# Patient Record
Sex: Female | Born: 1970
Health system: Southern US, Community
[De-identification: ages and names within clinical notes are randomized; demographics above are authoritative.]

## PROBLEM LIST (undated history)

## (undated) DIAGNOSIS — T7840XA Allergy, unspecified, initial encounter: Secondary | ICD-10-CM

## (undated) DIAGNOSIS — K921 Melena: Secondary | ICD-10-CM

## (undated) HISTORY — DX: Allergy, unspecified, initial encounter: T78.40XA

## (undated) HISTORY — DX: Melena: K92.1

## (undated) HISTORY — PX: BREAST BIOPSY: SHX20

---

## 2011-01-05 HISTORY — PX: EYE SURGERY: SHX253

## 2016-08-17 LAB — CBC AND DIFFERENTIAL
HCT: 40 (ref 36–46)
Hemoglobin: 13.8 (ref 12.0–16.0)
Platelets: 238 (ref 150–399)
WBC: 6

## 2016-08-17 LAB — BASIC METABOLIC PANEL
BUN: 15 (ref 4–21)
CREATININE: 0.7 (ref <=1.1)
Glucose: 83
Potassium: 4 (ref 3.4–5.3)
Sodium: 137 (ref 137–147)

## 2016-08-22 LAB — BASIC METABOLIC PANEL
BUN: 15 (ref 4–21)
Creatinine: 0.7 (ref <=1.1)
GLUCOSE: 83
Potassium: 4 (ref 3.4–5.3)
Sodium: 137 (ref 137–147)

## 2016-08-22 LAB — CBC AND DIFFERENTIAL
HCT: 40 (ref 36–46)
Hemoglobin: 13.8 (ref 12.0–16.0)
Platelets: 238 (ref 150–399)
WBC: 6

## 2016-08-22 LAB — TSH: TSH: 2.76 (ref <=5.90)

## 2017-02-13 ENCOUNTER — Encounter: Payer: Self-pay | Admitting: Family Medicine

## 2017-02-13 LAB — HM MAMMOGRAPHY

## 2018-03-25 DIAGNOSIS — Z13220 Encounter for screening for lipoid disorders: Secondary | ICD-10-CM | POA: Diagnosis not present

## 2018-03-25 DIAGNOSIS — Z131 Encounter for screening for diabetes mellitus: Secondary | ICD-10-CM | POA: Diagnosis not present

## 2018-03-25 DIAGNOSIS — Z136 Encounter for screening for cardiovascular disorders: Secondary | ICD-10-CM | POA: Diagnosis not present

## 2018-03-25 DIAGNOSIS — Z0130 Encounter for examination of blood pressure without abnormal findings: Secondary | ICD-10-CM | POA: Diagnosis not present

## 2018-03-25 DIAGNOSIS — Z6829 Body mass index (BMI) 29.0-29.9, adult: Secondary | ICD-10-CM | POA: Diagnosis not present

## 2018-03-25 DIAGNOSIS — Z713 Dietary counseling and surveillance: Secondary | ICD-10-CM | POA: Diagnosis not present

## 2018-09-24 ENCOUNTER — Ambulatory Visit (INDEPENDENT_AMBULATORY_CARE_PROVIDER_SITE_OTHER): Payer: BLUE CROSS/BLUE SHIELD | Admitting: Family Medicine

## 2018-09-24 ENCOUNTER — Encounter: Payer: Self-pay | Admitting: Family Medicine

## 2018-09-24 VITALS — BP 119/79 | HR 78 | Temp 98.5°F | Resp 16 | Ht 63.0 in | Wt 179.2 lb

## 2018-09-24 DIAGNOSIS — Z3009 Encounter for other general counseling and advice on contraception: Secondary | ICD-10-CM

## 2018-09-24 DIAGNOSIS — Z1239 Encounter for other screening for malignant neoplasm of breast: Secondary | ICD-10-CM

## 2018-09-24 DIAGNOSIS — Z3041 Encounter for surveillance of contraceptive pills: Secondary | ICD-10-CM | POA: Insufficient documentation

## 2018-09-24 DIAGNOSIS — Z7689 Persons encountering health services in other specified circumstances: Secondary | ICD-10-CM | POA: Diagnosis not present

## 2018-09-24 DIAGNOSIS — E669 Obesity, unspecified: Secondary | ICD-10-CM | POA: Diagnosis not present

## 2018-09-24 MED ORDER — NORGESTREL-ETHINYL ESTRADIOL 0.3-30 MG-MCG PO TABS: 1.0000 | ORAL_TABLET | Freq: Every day | ORAL | 0 refills | Status: DC

## 2018-09-24 NOTE — Progress Notes (Signed)
Patient ID: Traci Flynn, female  DOB: [birth year], 48 y.o.   MRN: [id number] Patient Care Team    Relationship Specialty Notifications Start End  Ma Hillock, DO PCP - General Family Medicine  09/24/18     Chief Complaint  Patient presents with  . Establish Care     Moved here x1 year ago, Needs pap, mammogram, Prior MD Dr Kathrynn Speed    Subjective:  Traci Flynn is a 48 y.o.  female present for new patient establishment. All past medical history, surgical history, allergies, family history, immunizations, medications and social history were updated in the electronic medical record today. All recent labs, ED visits and hospitalizations within the last year were reviewed. She and her family have moved from [place] 2019.   BCP: G1P1, pt reports she has been on BCP for many years since the birth of her child 2006 (daughter). She does well on medication. Patient's last menstrual period was 09/16/2018 (exact date). Last PAP 2016. Last mam 2018.  She is due for CPE after February. H/O of left breast biopsy/lumpectomy.   Depression screen [facility] 2/9 09/24/2018  Decreased Interest 0  Down, Depressed, Hopeless 0  PHQ - 2 Score 0   GAD 7 : Generalized Anxiety Score 09/24/2018  Nervous, Anxious, on Edge 0  Control/stop worrying 0  Worry too much - different things 0  Trouble relaxing 0  Restless 0  Easily annoyed or irritable 0  Afraid - awful might happen 0  Total GAD 7 Score 0  Anxiety Difficulty Not difficult at all       No flowsheet data found.   There is no immunization history on file for this patient.  No exam data present  Past Medical History:  Diagnosis Date  . Allergies    Seasonal   . Blood in stool    Pt has hemorrhoids   Allergies  Allergen Reactions  . Corn Pollen     Tree pollen and ragweed  . Soybean Extract Allergy Skin Test    Past Surgical History:  Procedure Laterality Date  . BREAST BIOPSY Left    Negative   . EYE SURGERY Left  01/05/2011   Preventative Lattice Surgery    Family History  Problem Relation Age of Onset  . Diabetes Mother   . Hypertension Mother   . Pancreatic cancer Father   . Non-Hodgkin's lymphoma Father   . Alzheimer's disease Maternal Grandmother   . Heart disease Maternal Grandmother   . Bladder Cancer Maternal Grandfather   . Heart disease Paternal Grandmother   . Kidney failure Paternal Grandfather    Social History   Socioeconomic History  . Marital status: Married    Spouse name: Not on file  . Number of children: Not on file  . Years of education: Not on file  . Highest education level: Not on file  Occupational History  . Not on file  Social Needs  . Financial resource strain: Not on file  . Food insecurity:    Worry: Not on file    Inability: Not on file  . Transportation needs:    Medical: Not on file    Non-medical: Not on file  Tobacco Use  . Smoking status: Never Smoker  . Smokeless tobacco: Never Used  Substance and Sexual Activity  . Alcohol use: Never    Frequency: Never  . Drug use: Never  . Sexual activity: Yes    Partners: Male  Lifestyle  . Physical activity:  Days per week: Not on file    Minutes per session: Not on file  . Stress: Not on file  Relationships  . Social connections:    Talks on phone: Not on file    Gets together: Not on file    Attends religious service: Not on file    Active member of club or organization: Not on file    Attends meetings of clubs or organizations: Not on file    Relationship status: Not on file  . Intimate partner violence:    Fear of current or ex partner: Not on file    Emotionally abused: Not on file    Physically abused: Not on file    Forced sexual activity: Not on file  Other Topics Concern  . Not on file  Social History Narrative   Marital status/children/pets: married, 1 child   Education/employment: BS. Homemaker   Safety:      -Wears a bicycle helmet riding a bike: Yes     -smoke alarm in  the home:Yes     - wears seatbelt: Yes     - Feels safe in their relationships: Yes   Allergies as of 09/24/2018      Reactions   Corn Pollen    Tree pollen and ragweed   Soybean Extract Allergy Skin Test       Medication List       Accurate as of September 24, 2018  9:56 AM. Always use your most recent med list.        cetirizine 10 MG tablet Commonly known as:  ZYRTEC Take 10 mg by mouth daily.   multivitamin with minerals Tabs tablet Take 1 tablet by mouth daily.   norgestrel-ethinyl estradiol 0.3-30 MG-MCG tablet Commonly known as:  LOW-OGESTREL Take 1 tablet by mouth daily.       All past medical history, surgical history, allergies, family history, immunizations andmedications were updated in the EMR today and reviewed under the history and medication portions of their EMR.    No results found for this or any previous visit (from the past 2160 hour(s)).  Patient was never admitted.   ROS: 14 pt review of systems performed and negative (unless mentioned in an HPI)  Objective: BP 119/79 (BP Location: Right Arm, Patient Position: Sitting, Cuff Size: Normal)   Pulse 78   Temp 98.5 F (36.9 C) (Oral)   Resp 16   Ht 5\' 3"  (1.6 m)   Wt 179 lb 4 oz (81.3 kg)   LMP 09/16/2018 (Exact Date)   SpO2 100%   BMI 31.75 kg/m  Gen: Afebrile. No acute distress. Nontoxic in appearance, well-developed, well-nourished,  Obese caucasian female.  HENT: AT. [place]. MMM Eyes:Pupils Equal Round Reactive to light, Extraocular movements intact,  Conjunctiva without redness, discharge or icterus. Neck/lymp/endocrine: Supple,no lymphadenopathy, no thyromegaly CV: RRR no murmur, no edema, +2/4 P posterior tibialis pulses.  Chest: CTAB, no wheeze, rhonchi or crackles.  Skin: Warm and well-perfused. Skin intact. Neuro/Msk:  Normal gait. PERLA. EOMi. Alert. Oriented x3.   Psych: Normal affect, dress and demeanor. Normal speech. Normal thought content and  judgment.   Assessment/plan: Traci Flynn is a 48 y.o. female present for New pt establishment Obesity (BMI 30-39.9) Diet and exercise will covered in more detail at upcoming CPE  Birth control counseling - refilled BCP for 3 mos.  - PAP due- she will schedule her CPE within next 3 months- with PAP.   Breast cancer screening - MM 3D SCREEN BREAST BILATERAL;  Future- [facility] pt choice    Return in about 2 months (around 11/23/2018) for CPE.   Note is dictated utilizing voice recognition software. Although note has been proof read prior to signing, occasional typographical errors still can be missed. If any questions arise, please do not hesitate to call for verification.  Electronically signed by: Howard Pouch, DO [place]

## 2018-09-24 NOTE — Patient Instructions (Addendum)
They will call you to schedule your mammogram at [facility] [place].  [address], [address] Schedule physical with PAP within 1-3 months. Come fasting.  Refilled your BCP for 3 mos.   Please help [place] help you:  We are honored you have chosen [facility] for your Primary Care home. Below you will find basic instructions that you may need to access in the future. Please help [place] help you by reading the instructions, which cover many of the frequent questions we experience.   Prescription refills and request:  -In order to allow more efficient response time, please call your pharmacy for all refills. They will forward the request electronically to [place]. This allows for the quickest possible response. Request left on a nurse line can take longer to refill, since these are checked as time allows between office patients and other phone calls.  - refill request can take up to 3-5 working days to complete.  - If request is sent electronically and request is appropiate, it is usually completed in 1-2 business days.  - all patients will need to be seen routinely for all chronic medical conditions requiring prescription medications (see follow-up below). If you are overdue for follow up on your condition, you will be asked to make an appointment and we will call in enough medication to cover you until your appointment (up to 30 days).  - all controlled substances will require a face to face visit to request/refill.  - if you desire your prescriptions to go through a new pharmacy, and have an active script at original pharmacy, you will need to call your pharmacy and have scripts transferred to new pharmacy. This is completed between the pharmacy locations and not by your provider.    Results: If any images or labs were ordered, it can take up to 1 week to get results depending on the test ordered and the lab/facility running and resulting the test. - Normal or stable results, which do  not need further discussion, may be released to your mychart immediately with attached note to you. A call may not be generated for normal results. Please make certain to sign up for mychart. If you have questions on how to activate your mychart you can call the front office.  - If your results need further discussion, our office will attempt to contact you via phone, and if unable to reach you after 2 attempts, we will release your abnormal result to your mychart with instructions.  - All results will be automatically released in mychart after 1 week.  - Your provider will provide you with explanation and instruction on all relevant material in your results. Please keep in mind, results and labs may appear confusing or abnormal to the untrained eye, but it does not mean they are actually abnormal for you personally. If you have any questions about your results that are not covered, or you desire more detailed explanation than what was provided, you should make an appointment with your provider to do so.   Our office handles many outgoing and incoming calls daily. If we have not contacted you within 1 week about your results, please check your mychart to see if there is a message first and if not, then contact our office.  In helping with this matter, you help decrease call volume, and therefore allow [place] to be able to respond to patients needs more efficiently.   Acute office visits (sick visit):  An  acute visit is intended for a new problem and are scheduled in shorter time slots to allow schedule openings for patients with new problems. This is the appropriate visit to discuss a new problem. Problems will not be addressed by phone call or Echart message. Appointment is needed if requesting treatment. In order to provide you with excellent quality medical care with proper time for you to explain your problem, have an exam and receive treatment with instructions, these appointments should be limited to one  new problem per visit. If you experience a new problem, in which you desire to be addressed, please make an acute office visit, we save openings on the schedule to accommodate you. Please do not save your new problem for any other type of visit, let us take care of it properly and quickly for you.   Follow up visits:  Depending on your condition(s) your provider will need to see you routinely in order to provide you with quality care and prescribe medication(s). Most chronic conditions (Example: hypertension, Diabetes, depression/anxiety... etc), require visits a couple times a year. Your provider will instruct you on proper follow up for your personal medical conditions and history. Please make certain to make follow up appointments for your condition as instructed. Failing to do so could result in lapse in your medication treatment/refills. If you request a refill, and are overdue to be seen on a condition, we will always provide you with a 30 day script (once) to allow you time to schedule.    Medicare wellness (well visit): - we have a wonderful Nurse Maudie Mercury), that will meet with you and provide you will yearly medicare wellness visits. These visits should occur yearly (can not be scheduled less than 1 calendar year apart) and cover preventive health, immunizations, advance directives and screenings you are entitled to yearly through your medicare benefits. Do not miss out on your entitled benefits, this is when medicare will pay for these benefits to be ordered for you.  These are strongly encouraged by your provider and is the appropriate type of visit to make certain you are up to date with all preventive health benefits. If you have not had your medicare wellness exam in the last 12 months, please make certain to schedule one by calling the office and schedule your medicare wellness with Maudie Mercury as soon as possible.   Yearly physical (well visit):  - Adults are recommended to be seen yearly for  physicals. Check with your insurance and date of your last physical, most insurances require one calendar year between physicals. Physicals include all preventive health topics, screenings, medical exam and labs that are appropriate for gender/age and history. You may have fasting labs needed at this visit. This is a well visit (not a sick visit), new problems should not be covered during this visit (see acute visit).  - Pediatric patients are seen more frequently when they are younger. Your provider will advise you on well child visit timing that is appropriate for your their age. - This is not a medicare wellness visit. Medicare wellness exams do not have an exam portion to the visit. Some medicare companies allow for a physical, some do not allow a yearly physical. If your medicare allows a yearly physical you can schedule the medicare wellness with our nurse Maudie Mercury and have your physical with your provider after, on the same day. Please check with insurance for your full benefits.   Late Policy/No Shows:  - all new patients should arrive 15-30  minutes earlier than appointment to allow [place] time  to  obtain all personal demographics,  insurance information and for you to complete office paperwork. - All established patients should arrive 10-15 minutes earlier than appointment time to update all information and be checked in .  - In our best efforts to run on time, if you are late for your appointment you will be asked to either reschedule or if able, we will work you back into the schedule. There will be a wait time to work you back in the schedule,  depending on availability.  - If you are unable to make it to your appointment as scheduled, please call 24 hours ahead of time to allow [place] to fill the time slot with someone else who needs to be seen. If you do not cancel your appointment ahead of time, you may be charged a no show fee.

## 2018-10-16 ENCOUNTER — Encounter: Payer: Self-pay | Admitting: Family Medicine

## 2018-10-29 ENCOUNTER — Ambulatory Visit (INDEPENDENT_AMBULATORY_CARE_PROVIDER_SITE_OTHER): Payer: BLUE CROSS/BLUE SHIELD | Admitting: Family Medicine

## 2018-10-29 ENCOUNTER — Encounter: Payer: Self-pay | Admitting: Family Medicine

## 2018-10-29 ENCOUNTER — Other Ambulatory Visit (HOSPITAL_COMMUNITY)
Admission: RE | Admit: 2018-10-29 | Discharge: 2018-10-29 | Disposition: A | Discharge status: Home / Self Care | Payer: BLUE CROSS/BLUE SHIELD | Source: Ambulatory Visit | Attending: Family Medicine | Admitting: Family Medicine

## 2018-10-29 VITALS — BP 129/80 | HR 71 | Temp 98.2°F | Resp 16 | Ht 63.0 in | Wt 177.5 lb

## 2018-10-29 DIAGNOSIS — Z01419 Encounter for gynecological examination (general) (routine) without abnormal findings: Secondary | ICD-10-CM | POA: Insufficient documentation

## 2018-10-29 DIAGNOSIS — Z130 Encounter for screening for diseases of the blood and blood-forming organs and certain disorders involving the immune mechanism: Secondary | ICD-10-CM | POA: Diagnosis not present

## 2018-10-29 DIAGNOSIS — Z3009 Encounter for other general counseling and advice on contraception: Secondary | ICD-10-CM

## 2018-10-29 DIAGNOSIS — Z0000 Encounter for general adult medical examination without abnormal findings: Secondary | ICD-10-CM | POA: Diagnosis not present

## 2018-10-29 DIAGNOSIS — Z79899 Other long term (current) drug therapy: Secondary | ICD-10-CM | POA: Diagnosis not present

## 2018-10-29 DIAGNOSIS — Z131 Encounter for screening for diabetes mellitus: Secondary | ICD-10-CM | POA: Diagnosis not present

## 2018-10-29 DIAGNOSIS — E669 Obesity, unspecified: Secondary | ICD-10-CM | POA: Diagnosis not present

## 2018-10-29 LAB — LIPID PANEL
CHOLESTEROL: 151 mg/dL (ref 0–200)
HDL: 50.8 mg/dL (ref >39.00)
LDL Cholesterol: 86 mg/dL (ref 0–99)
NonHDL: 100.64
Total CHOL/HDL Ratio: 3
Triglycerides: 71 mg/dL (ref 0.0–149.0)
VLDL: 14.2 mg/dL (ref 0.0–40.0)

## 2018-10-29 LAB — COMPREHENSIVE METABOLIC PANEL
ALT: 13 U/L (ref 0–35)
AST: 13 U/L (ref 0–37)
Albumin: 3.9 g/dL (ref 3.5–5.2)
Alkaline Phosphatase: 50 U/L (ref 39–117)
BUN: 12 mg/dL (ref 6–23)
CO2: 28 mEq/L (ref 19–32)
Calcium: 8.7 mg/dL (ref 8.4–10.5)
Chloride: 102 mEq/L (ref 96–112)
Creatinine, Ser: 0.71 mg/dL (ref 0.40–1.20)
GFR: 88.06 mL/min (ref >60.00)
Glucose, Bld: 74 mg/dL (ref 70–99)
Potassium: 4 mEq/L (ref 3.5–5.1)
Sodium: 136 mEq/L (ref 135–145)
Total Bilirubin: 0.5 mg/dL (ref 0.2–1.2)
Total Protein: 7 g/dL (ref 6.0–8.3)

## 2018-10-29 LAB — CBC
HCT: 41.1 % (ref 36.0–46.0)
Hemoglobin: 13.6 g/dL (ref 12.0–15.0)
MCHC: 33.1 g/dL (ref 30.0–36.0)
MCV: 93.2 fl (ref 78.0–100.0)
Platelets: 230 10*3/uL (ref 150.0–400.0)
RBC: 4.41 Mil/uL (ref 3.87–5.11)
RDW: 13.3 % (ref 11.5–15.5)
WBC: 5.7 10*3/uL (ref 4.0–10.5)

## 2018-10-29 LAB — HM PAP SMEAR

## 2018-10-29 LAB — HEMOGLOBIN A1C: HEMOGLOBIN A1C: 5.2 % (ref 4.6–6.5)

## 2018-10-29 MED ORDER — NORGESTREL-ETHINYL ESTRADIOL 0.3-30 MG-MCG PO TABS: 1.0000 | ORAL_TABLET | Freq: Every day | ORAL | 4 refills | Status: DC

## 2018-10-29 NOTE — Patient Instructions (Signed)

## 2018-10-29 NOTE — Progress Notes (Signed)
Patient ID: Traci Flynn, female  DOB: December 05, 1970, 48 y.o.   MRN: [id number] Patient Care Team    Relationship Specialty Notifications Start End  Ma Hillock, DO PCP - General Family Medicine  09/24/18     Chief Complaint  Patient presents with  . Gynecologic Exam    Mammogram scheduled for this Friday, 10/31/2018  . Annual Exam    Fasting. No complaints     Subjective:  Traci Flynn is a 48 y.o.  Female  present for CPE. All past medical history, surgical history, allergies, family history, immunizations, medications and social history were updated in the electronic medical record today. All recent labs, ED visits and hospitalizations within the last year were reviewed.  Well women: G1P1. On BCP. Cycles are unchanged and normal. She denies vaginal discharge, lesions or pain. Married. Monogamous relationship. Prior paps all normal.   Health maintenance:  Colonoscopy: no fhx.  Routine screen at 50 Mammogram: no fhx. completed: ordered last visit. Scheduled this week.. Performs SBE. Cervical cancer screening: last pap: 2016,  Collected today Immunizations: tdap 2019 UTD, Influenza 2019 UTD (encouraged yearly), Infectious disease screening: HIV completed DEXA: routine  Assistive device: none Oxygen TMA:UQJF Patient has a Dental home. Hospitalizations/ED visits: reviewed  Depression screen [facility] 2/9 09/24/2018  Decreased Interest 0  Down, Depressed, Hopeless 0  PHQ - 2 Score 0   GAD 7 : Generalized Anxiety Score 09/24/2018  Nervous, Anxious, on Edge 0  Control/stop worrying 0  Worry too much - different things 0  Trouble relaxing 0  Restless 0  Easily annoyed or irritable 0  Afraid - awful might happen 0  Total GAD 7 Score 0  Anxiety Difficulty Not difficult at all    Immunization History  Administered Date(s) Administered  . Tdap 09/30/2017    Past Medical History:  Diagnosis Date  . Allergies    Seasonal   . Blood in stool    Pt has hemorrhoids    Allergies  Allergen Reactions  . Corn Pollen     Tree pollen and ragweed  . Soybean Extract Allergy Skin Test    Past Surgical History:  Procedure Laterality Date  . BREAST LUMPECTOMY Left    subareolar ductal breast excision - with add'l inferior margin lumpectomy. Fibrocystic change w/ stromal fibrosis and periductal  chronic inflammation. Duct ectasia and focal fibroadenomatoid change. negative for atypical hyperplasia or malignancy.   Marland Kitchen EYE SURGERY Left 01/05/2011   Preventative Lattice Surgery    Family History  Problem Relation Age of Onset  . Diabetes Mother   . Hypertension Mother   . Pancreatic cancer Father   . Non-Hodgkin's lymphoma Father   . Alzheimer's disease Maternal Grandmother   . Heart disease Maternal Grandmother   . Bladder Cancer Maternal Grandfather   . Heart disease Paternal Grandmother   . Kidney failure Paternal Grandfather    Social History   Social History Narrative   Marital status/children/pets: married, 1 child   Education/employment: BS. Homemaker   Safety:      -Wears a bicycle helmet riding a bike: Yes     -smoke alarm in the home:Yes     - wears seatbelt: Yes     - Feels safe in their relationships: Yes    Allergies as of 10/29/2018      Reactions   Corn Pollen    Tree pollen and ragweed   Soybean Extract Allergy Skin Test       Medication List  Accurate as of October 29, 2018  9:49 AM. Always use your most recent med list.        cetirizine 10 MG tablet Commonly known as:  ZYRTEC Take 10 mg by mouth daily.   multivitamin with minerals Tabs tablet Take 1 tablet by mouth daily.   norgestrel-ethinyl estradiol 0.3-30 MG-MCG tablet Commonly known as:  LOW-OGESTREL Take 1 tablet by mouth daily.       All past medical history, surgical history, allergies, family history, immunizations andmedications were updated in the EMR today and reviewed under the history and medication portions of their EMR.     No results found  for this or any previous visit (from the past 2160 hour(s)).  Patient was never admitted.   ROS: 14 pt review of systems performed and negative (unless mentioned in an HPI)  Objective: BP 129/80 (BP Location: Right Arm, Patient Position: Sitting, Cuff Size: Normal)   Pulse 71   Temp 98.2 F (36.8 C) (Oral)   Resp 16   Ht _0  (1.6 m)   Wt 177 lb 8 oz (80.5 kg)   LMP 10/15/2018   SpO2 100%   BMI 31.44 kg/m  Gen: Afebrile. No acute distress. Nontoxic in appearance, well-developed, well-nourished,  Obese, Caucasian female.  HENT: AT. [place]. Bilateral TM visualized and normal in appearance, normal external auditory canal. MMM, no oral lesions, adequate dentition. Bilateral nares within normal limits. Throat without erythema, ulcerations or exudates. no Cough on exam, no hoarseness on exam. Eyes:Pupils Equal Round Reactive to light, Extraocular movements intact,  Conjunctiva without redness, discharge or icterus. Neck/lymp/endocrine: Supple,no lymphadenopathy, no thyromegaly CV: RRR no murmur, no edema, +2/4 P posterior tibialis pulses. no carotid bruits. No JVD. Chest: CTAB, no wheeze, rhonchi or crackles. normal Respiratory effort. good Air movement. Abd: Soft. flat. NTND. BS present. no Masses palpated. No hepatosplenomegaly. No rebound tenderness or guarding. Skin: no rashes, purpura or petechiae. Warm and well-perfused. Skin intact. Neuro/Msk:  Normal gait. PERLA. EOMi. Alert. Oriented x3.  Cranial nerves II through XII intact. Muscle strength 5/5 upper/lower extremity. DTRs equal bilaterally. Psych: Normal affect, dress and demeanor. Normal speech. Normal thought content and judgment. Breasts: breasts appear normal, symmetrical, no tenderness on exam, no suspicious masses, no skin or nipple changes or axillary nodes. GYN:  External genitalia within normal limits, normal hair distribution, no lesions. Urethral meatus normal, no lesions. Vaginal mucosa pink, moist, normal rugae, no  lesions. No cystocele or rectocele. cervix without lesions- friable, no discharge. Bimanual exam revealed normal uterus.  No bladder/suprapubic fullness, masses or tenderness. No cervical motion tenderness. No adnexal fullness. Anus and perineum within normal limits, no lesions.  No exam data present  Assessment/plan: Traci Flynn is a 48 y.o. female present for CPE. Birth control counseling - refills on BCP - followup yearly with CPE Obesity (BMI 30-39.9) - diet and exercise encouraged - Lipid panel Papanicolaou smear, as part of routine gynecological examination - Cytology - PAP( [place]) with HPV cotest Screening for deficiency anemia - CBC Diabetes mellitus screening - HgB A1c Encounter for long-term current use of medication - Comp Met (CMET) Encounter for preventive health examination Patient was encouraged to exercise greater than 150 minutes a week. Patient was encouraged to choose a diet filled with fresh fruits and vegetables, and lean meats. AVS provided to patient today for education/recommendation on gender specific health and safety maintenance. Colonoscopy: no fhx.  Routine screen at 50 Mammogram: no fhx. completed: ordered last visit. Scheduled this week.. Performs SBE.  Cervical cancer screening: last pap: 2016,  Collected today with HPV Immunizations: tdap 2019 UTD, Influenza 2019 UTD (encouraged yearly), Infectious disease screening: HIV completed DEXA: routine   Return in about 1 year (around 10/29/2019) for CPE.  Electronically signed by: Howard Pouch, DO [place]

## 2018-10-30 ENCOUNTER — Encounter: Payer: Self-pay | Admitting: Family Medicine

## 2018-10-30 LAB — CYTOLOGY - PAP
Diagnosis: NEGATIVE
HPV: NOT DETECTED

## 2018-10-31 ENCOUNTER — Encounter (HOSPITAL_BASED_OUTPATIENT_CLINIC_OR_DEPARTMENT_OTHER): Payer: Self-pay

## 2018-10-31 ENCOUNTER — Other Ambulatory Visit (HOSPITAL_BASED_OUTPATIENT_CLINIC_OR_DEPARTMENT_OTHER): Payer: Self-pay | Admitting: Family Medicine

## 2018-10-31 ENCOUNTER — Ambulatory Visit (HOSPITAL_BASED_OUTPATIENT_CLINIC_OR_DEPARTMENT_OTHER)
Admission: RE | Admit: 2018-10-31 | Discharge: 2018-10-31 | Disposition: A | Discharge status: Home / Self Care | Payer: BLUE CROSS/BLUE SHIELD | Source: Ambulatory Visit | Attending: Family Medicine | Admitting: Family Medicine

## 2018-10-31 DIAGNOSIS — Z1231 Encounter for screening mammogram for malignant neoplasm of breast: Secondary | ICD-10-CM | POA: Diagnosis not present

## 2018-11-11 ENCOUNTER — Encounter: Payer: BLUE CROSS/BLUE SHIELD | Admitting: Family Medicine

## 2018-11-25 ENCOUNTER — Encounter: Payer: BLUE CROSS/BLUE SHIELD | Admitting: Family Medicine

## 2019-04-22 DIAGNOSIS — Z13220 Encounter for screening for lipoid disorders: Secondary | ICD-10-CM | POA: Diagnosis not present

## 2019-04-22 DIAGNOSIS — Z131 Encounter for screening for diabetes mellitus: Secondary | ICD-10-CM | POA: Diagnosis not present

## 2019-04-22 DIAGNOSIS — Z136 Encounter for screening for cardiovascular disorders: Secondary | ICD-10-CM | POA: Diagnosis not present

## 2019-04-22 DIAGNOSIS — Z713 Dietary counseling and surveillance: Secondary | ICD-10-CM | POA: Diagnosis not present

## 2019-04-22 DIAGNOSIS — Z6830 Body mass index (BMI) 30.0-30.9, adult: Secondary | ICD-10-CM | POA: Diagnosis not present

## 2019-04-22 DIAGNOSIS — Z0130 Encounter for examination of blood pressure without abnormal findings: Secondary | ICD-10-CM | POA: Diagnosis not present

## 2019-06-17 DIAGNOSIS — Z23 Encounter for immunization: Secondary | ICD-10-CM | POA: Diagnosis not present

## 2019-10-22 ENCOUNTER — Other Ambulatory Visit (HOSPITAL_BASED_OUTPATIENT_CLINIC_OR_DEPARTMENT_OTHER): Payer: Self-pay | Admitting: Family Medicine

## 2019-10-22 DIAGNOSIS — Z1231 Encounter for screening mammogram for malignant neoplasm of breast: Secondary | ICD-10-CM

## 2019-11-04 ENCOUNTER — Ambulatory Visit (HOSPITAL_BASED_OUTPATIENT_CLINIC_OR_DEPARTMENT_OTHER)
Admission: RE | Admit: 2019-11-04 | Discharge: 2019-11-04 | Disposition: A | Discharge status: Home / Self Care | Payer: BC Managed Care – PPO | Source: Ambulatory Visit | Attending: Family Medicine | Admitting: Family Medicine

## 2019-11-04 ENCOUNTER — Other Ambulatory Visit: Payer: Self-pay

## 2019-11-04 DIAGNOSIS — Z1231 Encounter for screening mammogram for malignant neoplasm of breast: Secondary | ICD-10-CM | POA: Insufficient documentation

## 2019-11-20 ENCOUNTER — Ambulatory Visit: Payer: BC Managed Care – PPO | Attending: Internal Medicine

## 2019-11-20 DIAGNOSIS — Z23 Encounter for immunization: Secondary | ICD-10-CM

## 2019-11-20 NOTE — Progress Notes (Signed)
   Covid-19 Vaccination Clinic  Name:  Zinachidi Slusarz    MRN: [id number] DOB: 1970/12/12  11/20/2019  Ms. Rotondo was observed post Covid-19 immunization for 15 minutes without incident. She was provided with Vaccine Information Sheet and instruction to access the V-Safe system.   Ms. Fehler was instructed to call 911 with any severe reactions post vaccine: Marland Kitchen Difficulty breathing  . Swelling of face and throat  . A fast heartbeat  . A bad rash all over body  . Dizziness and weakness   Immunizations Administered    Name Date Dose VIS Date Route   Pfizer COVID-19 Vaccine 11/20/2019 10:32 AM 0.3 mL 08/07/2019 Intramuscular   Manufacturer: [place]   Lot: [id number]   [place]: [id number]

## 2019-12-14 ENCOUNTER — Ambulatory Visit: Payer: BC Managed Care – PPO | Attending: Internal Medicine

## 2019-12-14 DIAGNOSIS — Z23 Encounter for immunization: Secondary | ICD-10-CM

## 2019-12-14 NOTE — Progress Notes (Signed)
   Covid-19 Vaccination Clinic  Name:  Traci Flynn    MRN: [id number] DOB: Oct 21, 1970  12/14/2019  Traci Flynn was observed post Covid-19 immunization for 15 minutes without incident. She was provided with Vaccine Information Sheet and instruction to access the V-Safe system.   Traci Flynn was instructed to call 911 with any severe reactions post vaccine: Marland Kitchen Difficulty breathing  . Swelling of face and throat  . A fast heartbeat  . A bad rash all over body  . Dizziness and weakness   Immunizations Administered    Name Date Dose VIS Date Route   Pfizer COVID-19 Vaccine 12/14/2019  3:14 PM 0.3 mL 10/21/2018 Intramuscular   Manufacturer: [place]   Lot: [id number]   [place]: [id number]

## 2020-01-26 ENCOUNTER — Other Ambulatory Visit: Payer: Self-pay

## 2020-01-26 ENCOUNTER — Encounter: Payer: Self-pay | Admitting: Gastroenterology

## 2020-01-26 ENCOUNTER — Encounter: Payer: Self-pay | Admitting: Family Medicine

## 2020-01-26 ENCOUNTER — Ambulatory Visit (INDEPENDENT_AMBULATORY_CARE_PROVIDER_SITE_OTHER): Payer: BC Managed Care – PPO | Admitting: Family Medicine

## 2020-01-26 VITALS — BP 126/85 | HR 71 | Temp 98.1°F | Resp 18 | Ht 62.5 in | Wt 174.1 lb

## 2020-01-26 DIAGNOSIS — Z0000 Encounter for general adult medical examination without abnormal findings: Secondary | ICD-10-CM

## 2020-01-26 DIAGNOSIS — Z131 Encounter for screening for diabetes mellitus: Secondary | ICD-10-CM | POA: Diagnosis not present

## 2020-01-26 DIAGNOSIS — G8929 Other chronic pain: Secondary | ICD-10-CM | POA: Diagnosis not present

## 2020-01-26 DIAGNOSIS — R002 Palpitations: Secondary | ICD-10-CM | POA: Diagnosis not present

## 2020-01-26 DIAGNOSIS — Z793 Long term (current) use of hormonal contraceptives: Secondary | ICD-10-CM

## 2020-01-26 DIAGNOSIS — Z130 Encounter for screening for diseases of the blood and blood-forming organs and certain disorders involving the immune mechanism: Secondary | ICD-10-CM | POA: Diagnosis not present

## 2020-01-26 DIAGNOSIS — M25562 Pain in left knee: Secondary | ICD-10-CM | POA: Diagnosis not present

## 2020-01-26 DIAGNOSIS — Z1211 Encounter for screening for malignant neoplasm of colon: Secondary | ICD-10-CM

## 2020-01-26 DIAGNOSIS — E669 Obesity, unspecified: Secondary | ICD-10-CM | POA: Diagnosis not present

## 2020-01-26 LAB — COMPREHENSIVE METABOLIC PANEL
ALT: 14 U/L (ref 0–35)
AST: 15 U/L (ref 0–37)
Albumin: 4 g/dL (ref 3.5–5.2)
Alkaline Phosphatase: 45 U/L (ref 39–117)
BUN: 15 mg/dL (ref 6–23)
CO2: 28 mEq/L (ref 19–32)
Calcium: 9 mg/dL (ref 8.4–10.5)
Chloride: 103 mEq/L (ref 96–112)
Creatinine, Ser: 0.7 mg/dL (ref 0.40–1.20)
GFR: 89.05 mL/min (ref >60.00)
Glucose, Bld: 80 mg/dL (ref 70–99)
Potassium: 4.4 mEq/L (ref 3.5–5.1)
Sodium: 136 mEq/L (ref 135–145)
Total Bilirubin: 0.6 mg/dL (ref 0.2–1.2)
Total Protein: 6.9 g/dL (ref 6.0–8.3)

## 2020-01-26 LAB — LIPID PANEL
Cholesterol: 176 mg/dL (ref 0–200)
HDL: 48.9 mg/dL (ref >39.00)
LDL Cholesterol: 113 mg/dL — ABNORMAL HIGH (ref 0–99)
NonHDL: 127.25
Total CHOL/HDL Ratio: 4
Triglycerides: 71 mg/dL (ref 0.0–149.0)
VLDL: 14.2 mg/dL (ref 0.0–40.0)

## 2020-01-26 LAB — CBC
HCT: 41.4 % (ref 36.0–46.0)
Hemoglobin: 13.8 g/dL (ref 12.0–15.0)
MCHC: 33.3 g/dL (ref 30.0–36.0)
MCV: 93.7 fl (ref 78.0–100.0)
Platelets: 239 10*3/uL (ref 150.0–400.0)
RBC: 4.42 Mil/uL (ref 3.87–5.11)
RDW: 12.8 % (ref 11.5–15.5)
WBC: 5.4 10*3/uL (ref 4.0–10.5)

## 2020-01-26 LAB — HEMOGLOBIN A1C: Hgb A1c MFr Bld: 5.3 % (ref 4.6–6.5)

## 2020-01-26 LAB — TSH: TSH: 1.84 u[IU]/mL (ref 0.35–4.50)

## 2020-01-26 MED ORDER — LOW-OGESTREL 0.3-30 MG-MCG PO TABS: 1.0000 | ORAL_TABLET | Freq: Every day | ORAL | 4 refills | Status: DC

## 2020-01-26 NOTE — Patient Instructions (Signed)
Health Maintenance After Age 49 After age 49, you are at a higher risk for certain long-term diseases and infections as well as injuries from falls. Falls are a major cause of broken bones and head injuries in people who are older than age 49. Getting regular preventive care can help to keep you healthy and well. Preventive care includes getting regular testing and making lifestyle changes as recommended by your health care provider. Talk with your health care provider about:  Which screenings and tests you should have. A screening is a test that checks for a disease when you have no symptoms.  A diet and exercise plan that is right for you. What should I know about screenings and tests to prevent falls? Screening and testing are the best ways to find a health problem early. Early diagnosis and treatment give you the best chance of managing medical conditions that are common after age 49. Certain conditions and lifestyle choices may make you more likely to have a fall. Your health care provider may recommend:  Regular vision checks. Poor vision and conditions such as cataracts can make you more likely to have a fall. If you wear glasses, make sure to get your prescription updated if your vision changes.  Medicine review. Work with your health care provider to regularly review all of the medicines you are taking, including over-the-counter medicines. Ask your health care provider about any side effects that may make you more likely to have a fall. Tell your health care provider if any medicines that you take make you feel dizzy or sleepy.  Osteoporosis screening. Osteoporosis is a condition that causes the bones to get weaker. This can make the bones weak and cause them to break more easily.  Blood pressure screening. Blood pressure changes and medicines to control blood pressure can make you feel dizzy.  Strength and balance checks. Your health care provider may recommend certain tests to check your  strength and balance while standing, walking, or changing positions.  Foot health exam. Foot pain and numbness, as well as not wearing proper footwear, can make you more likely to have a fall.  Depression screening. You may be more likely to have a fall if you have a fear of falling, feel emotionally low, or feel unable to do activities that you used to do.  Alcohol use screening. Using too much alcohol can affect your balance and may make you more likely to have a fall. What actions can I take to lower my risk of falls? General instructions  Talk with your health care provider about your risks for falling. Tell your health care provider if: ? You fall. Be sure to tell your health care provider about all falls, even ones that seem minor. ? You feel dizzy, sleepy, or off-balance.  Take over-the-counter and prescription medicines only as told by your health care provider. These include any supplements.  Eat a healthy diet and maintain a healthy weight. A healthy diet includes low-fat dairy products, low-fat (lean) meats, and fiber from whole grains, beans, and lots of fruits and vegetables. Home safety  Remove any tripping hazards, such as rugs, cords, and clutter.  Install safety equipment such as grab bars in bathrooms and safety rails on stairs.  Keep rooms and walkways well-lit. Activity   Follow a regular exercise program to stay fit. This will help you maintain your balance. Ask your health care provider what types of exercise are appropriate for you.  If you need a cane or   walker, use it as recommended by your health care provider.  Wear supportive shoes that have nonskid soles. Lifestyle  Do not drink alcohol if your health care provider tells you not to drink.  If you drink alcohol, limit how much you have: ? 0-1 drink a day for women. ? 0-2 drinks a day for men.  Be aware of how much alcohol is in your drink. In the [place] one drink equals one typical bottle of beer (12  oz), one-half glass of wine (5 oz), or one shot of hard liquor (1 oz).  Do not use any products that contain nicotine or tobacco, such as cigarettes and e-cigarettes. If you need help quitting, ask your health care provider. Summary  Having a healthy lifestyle and getting preventive care can help to protect your health and wellness after age 49.  Screening and testing are the best way to find a health problem early and help you avoid having a fall. Early diagnosis and treatment give you the best chance for managing medical conditions that are more common for people who are older than age 49.  Falls are a major cause of broken bones and head injuries in people who are older than age 49. Take precautions to prevent a fall at home.  Work with your health care provider to learn what changes you can make to improve your health and wellness and to prevent falls. This information is not intended to replace advice given to you by your health care provider. Make sure you discuss any questions you have with your health care provider. Document Revised: 12/04/2018 Document Reviewed: 06/26/2017 Elsevier Patient Education  2020 [employer]vier Inc.  

## 2020-01-26 NOTE — Progress Notes (Signed)
This visit occurred during the SARS-CoV-2 public health emergency.  Safety protocols were in place, including screening questions prior to the visit, additional usage of staff PPE, and extensive cleaning of exam room while observing appropriate contact time as indicated for disinfecting solutions.    Patient ID: Traci Flynn, female  DOB: 06/03/1971, 49 y.o.   MRN: [id number] Patient Care Team    Relationship Specialty Notifications Start End  Ma Hillock, DO PCP - General Family Medicine  09/24/18     Chief Complaint  Patient presents with  . Annual Exam    Fasting, Pap 10/2018, Mammogram 10/2019. Needs refills on meds     Subjective:  Traci Flynn is a 49 y.o.  Female  present for CPE. All past medical history, surgical history, allergies, family history, immunizations, medications and social history were updated in the electronic medical record today. All recent labs, ED visits and hospitalizations within the last year were reviewed.  Patient does complain of infrequent palpitations today.  She reports they are fleeting and only last a few seconds are not associated with any symptoms.  Left knee pain: Patient reports he twisted injury about 1 year ago.  She was not seen at the time of her injury.  She reports she was standing at her kitchen sink and turned to move and twisted her knee.  At that time she did feel a pop and she reports her knee did become immediately swollen.  Her knee will give out on occasions since.  It is painful to walk upstairs.  It occasionally will become swollen and more uncomfortable.  She uses anti-inflammatories when this occurs.  Well women: G1P1. On BCP. Cycles are unchanged and normal. She denies vaginal discharge, lesions or pain. Married. Monogamous relationship. Prior paps all normal.   Health maintenance:  Colonoscopy: no fhx.  Referred to gastroenterology for routine screening. Mammogram: no fhx. completed: 11/04/2019-[facility] Cervical cancer  screening: last pap: 2020,  5 yr f/u Immunizations: tdap 2019 UTD, Influenza  (encouraged yearly),COVID completed Infectious disease screening: HIV completed DEXA: routine  Assistive device: none Oxygen [id number] Patient has a Dental home. Hospitalizations/ED visits: reviewed   Depression screen [facility] 2/9 01/26/2020 09/24/2018  Decreased Interest 0 0  Down, Depressed, Hopeless 0 0  PHQ - 2 Score 0 0   GAD 7 : Generalized Anxiety Score 09/24/2018  Nervous, Anxious, on Edge 0  Control/stop worrying 0  Worry too much - different things 0  Trouble relaxing 0  Restless 0  Easily annoyed or irritable 0  Afraid - awful might happen 0  Total GAD 7 Score 0  Anxiety Difficulty Not difficult at all    Immunization History  Administered Date(s) Administered  . Influenza,inj,Quad PF,6+ Mos 06/17/2019  . Influenza-Unspecified 06/17/2019  . PFIZER SARS-COV-2 Vaccination 11/20/2019, 12/14/2019  . Tdap 09/30/2017    Past Medical History:  Diagnosis Date  . Allergies    Seasonal   . Blood in stool    Pt has hemorrhoids   Allergies  Allergen Reactions  . Corn Pollen     Tree pollen and ragweed  . Hazel Tree Pollen  [Corylus] Itching  . Soybean Extract Allergy Skin Test    Past Surgical History:  Procedure Laterality Date  . BREAST BIOPSY Left   . EYE SURGERY Left 01/05/2011   Preventative Lattice Surgery    Family History  Problem Relation Age of Onset  . Diabetes Mother   . Hypertension Mother   . Pancreatic cancer Father   .  Non-Hodgkin's lymphoma Father   . Alzheimer's disease Maternal Grandmother   . Heart disease Maternal Grandmother   . Bladder Cancer Maternal Grandfather   . Heart disease Paternal Grandmother   . Kidney failure Paternal Grandfather    Social History   Social History Narrative   Marital status/children/pets: married, 1 child   Education/employment: BS. Homemaker   Safety:      -Wears a bicycle helmet riding a bike: Yes     -smoke alarm in the  home:Yes     - wears seatbelt: Yes     - Feels safe in their relationships: Yes    Allergies as of 01/26/2020      Reactions   Corn Pollen    Tree pollen and ragweed   Hazel Tree Pollen  [corylus] Itching   Soybean Extract Allergy Skin Test       Medication List       Accurate as of January 26, 2020 11:59 PM. If you have any questions, ask your nurse or doctor.        cetirizine 10 MG tablet Commonly known as: ZYRTEC Take 10 mg by mouth daily.   Low-Ogestrel 0.3-30 MG-MCG tablet Generic drug: norgestrel-ethinyl estradiol Take 1 tablet by mouth daily.   multivitamin with minerals Tabs tablet Take 1 tablet by mouth daily.       All past medical history, surgical history, allergies, family history, immunizations andmedications were updated in the EMR today and reviewed under the history and medication portions of their EMR.     Recent Results (from the past 2160 hour(s))  CBC     Status: None   Collection Time: 01/26/20 11:01 AM  Result Value Ref Range   WBC 5.4 4.0 - 10.5 K/uL   RBC 4.42 3.87 - 5.11 Mil/uL   Platelets 239.0 150.0 - 400.0 K/uL   Hemoglobin 13.8 12.0 - 15.0 g/dL   HCT 41.4 36.0 - 46.0 %   MCV 93.7 78.0 - 100.0 fl   MCHC 33.3 30.0 - 36.0 g/dL   RDW 12.8 11.5 - 15.5 %  Comprehensive metabolic panel     Status: None   Collection Time: 01/26/20 11:01 AM  Result Value Ref Range   Sodium 136 135 - 145 mEq/L   Potassium 4.4 3.5 - 5.1 mEq/L   Chloride 103 96 - 112 mEq/L   CO2 28 19 - 32 mEq/L   Glucose, Bld 80 70 - 99 mg/dL   BUN 15 6 - 23 mg/dL   Creatinine, Ser 0.70 0.40 - 1.20 mg/dL   Total Bilirubin 0.6 0.2 - 1.2 mg/dL   Alkaline Phosphatase 45 39 - 117 U/L   AST 15 0 - 37 U/L   ALT 14 0 - 35 U/L   Total Protein 6.9 6.0 - 8.3 g/dL   Albumin 4.0 3.5 - 5.2 g/dL   GFR 89.05 >60.00 mL/min   Calcium 9.0 8.4 - 10.5 mg/dL  Hemoglobin A1c     Status: None   Collection Time: 01/26/20 11:01 AM  Result Value Ref Range   Hgb A1c MFr Bld 5.3 4.6 - 6.5 %     Comment: Glycemic Control Guidelines for People with Diabetes:Non Diabetic:  <6%Goal of Therapy: <7%Additional Action Suggested:  >8%   Lipid panel     Status: Abnormal   Collection Time: 01/26/20 11:01 AM  Result Value Ref Range   Cholesterol 176 0 - 200 mg/dL    Comment: ATP III Classification       Desirable:  <  200 mg/dL               Borderline High:  200 - 239 mg/dL          High:  > = 240 mg/dL   Triglycerides 71.0 0.0 - 149.0 mg/dL    Comment: Normal:  <150 mg/dLBorderline High:  150 - 199 mg/dL   HDL 48.90 >39.00 mg/dL   VLDL 14.2 0.0 - 40.0 mg/dL   LDL Cholesterol 113 (H) 0 - 99 mg/dL   Total CHOL/HDL Ratio 4     Comment:                Men          Women1/2 Average Risk     3.4          3.3Average Risk          5.0          4.42X Average Risk          9.6          7.13X Average Risk          15.0          11.0                       NonHDL 127.25     Comment: NOTE:  Non-HDL goal should be 30 mg/dL higher than patient's LDL goal (i.e. LDL goal of < 70 mg/dL, would have non-HDL goal of < 100 mg/dL)  TSH     Status: None   Collection Time: 01/26/20 11:01 AM  Result Value Ref Range   TSH 1.84 0.35 - 4.50 uIU/mL    MM 3D SCREEN BREAST BILATERAL  Result Date: 11/04/2019 CLINICAL DATA:  Screening. EXAM: DIGITAL SCREENING BILATERAL MAMMOGRAM WITH TOMO AND CAD COMPARISON:  Previous exam(s). ACR Breast Density Category b: There are scattered areas of fibroglandular density. FINDINGS: There are no findings suspicious for malignancy. Images were processed with CAD. IMPRESSION: No mammographic evidence of malignancy. A result letter of this screening mammogram will be mailed directly to the patient. RECOMMENDATION: Screening mammogram in one year. (Code:SM-B-01Y) BI-RADS CATEGORY  1: Negative. Electronically Signed   By: Kristopher Oppenheim M.D.   On: 11/04/2019 14:41     ROS: 14 pt review of systems performed and negative (unless mentioned in an HPI)  Objective: BP 126/85 (BP Location: Right  Arm, Patient Position: Sitting, Cuff Size: Normal)   Pulse 71   Temp 98.1 F (36.7 C) (Temporal)   Resp 18   Ht 5' 2.5" (1.588 m)   Wt 174 lb 2 oz (79 kg)   LMP 01/05/2020 (Exact Date)   SpO2 100%   BMI 31.34 kg/m  Gen: Afebrile. No acute distress. Nontoxic in appearance, well-developed, well-nourished, female HENT: AT. [place]. Bilateral TM visualized and normal in appearance, normal external auditory canal. MMM, no oral lesions, adequate dentition. Bilateral nares within normal limits. Throat without erythema, ulcerations or exudates.  No cough on exam, no hoarseness on exam. Eyes:Pupils Equal Round Reactive to light, Extraocular movements intact,  Conjunctiva without redness, discharge or icterus. Neck/lymp/endocrine: Supple, no lymphadenopathy, no thyromegaly CV: RRR no murmur, no edema, +2/4 P posterior tibialis pulses.  Chest: CTAB, no wheeze, rhonchi or crackles.  Normal respiratory effort.  Good air movement. Abd: Soft.  Overweight. NTND. BS present.  No masses palpated. No hepatosplenomegaly. No rebound tenderness or guarding. Skin: No rashes, purpura or petechiae. Warm and well-perfused. Skin intact. Neuro/Msk:  Normal gait. PERLA. EOMi. Alert. Oriented x3.  Cranial nerves II through XII intact. Muscle strength 5/5 upper/lower extremity.  Left knee: No erythema, no soft tissue swelling.  Tender to palpation medial joint line.  Decreased range of motion in flexion with discomfort.  Mild crepitus with extension.  No obvious ligament laxity present.  Neurovascularly intact distally. Psych: Normal affect, dress and demeanor. Normal speech. Normal thought content and judgment.  No exam data present  Assessment/plan: Sundari Payan is a 49 y.o. female present for CPE  Obesity (BMI 30-39.9) Body mass index is 31.34 kg/m. Routine exercise/healthy diet Long term current use of hormonal contraceptive Continue norgestrel-ethinyl estradiol 0.3-30 mg-MCG tablet - Comprehensive metabolic  panel - Lipid panel Diabetes mellitus screening - Hemoglobin A1c Screening for deficiency anemia - CBC Colon cancer screening - Ambulatory referral to Gastroenterology Palpitations Advised to avoid caffeine use.  Hydrate. She was encouraged to follow-up if palpitations worsen, currently only occurring very infrequently and without any associated symptoms. Will rule out thyroid as a potential cause. Patient was educated on concerning associated symptoms and emergent follow-up. - TSH  Chronic pain of left knee Injury approximately 1 year ago.  Possible meniscal versus ligament strain/tear.  Discussed with her options for treatment.  Since it has been over a year I recommended she see orthopedics for further evaluation and treatment plan. In the meantime RICE therapy and NSAIDs during flares. - Ambulatory referral to Orthopedic Surgery  Encounter for preventive health examination Patient was encouraged to exercise greater than 150 minutes a week. Patient was encouraged to choose a diet filled with fresh fruits and vegetables, and lean meats. AVS provided to patient today for education/recommendation on gender specific health and safety maintenance. Colonoscopy: no fhx.  Referred to gastroenterology for routine screening. Mammogram: no fhx. completed: 11/04/2019-[facility] Cervical cancer screening: last pap: 2020,  5 yr f/u Immunizations: tdap 2019 UTD, Influenza  (encouraged yearly),COVID completed Infectious disease screening: HIV completed DEXA: routine  Return in about 1 year (around 01/25/2021) for CPE (30 min).   Orders Placed This Encounter  Procedures  . CBC  . Comprehensive metabolic panel  . Hemoglobin A1c  . Lipid panel  . TSH  . Ambulatory referral to Gastroenterology  . Ambulatory referral to Orthopedic Surgery   Meds ordered this encounter  Medications  . norgestrel-ethinyl estradiol (LOW-OGESTREL) 0.3-30 MG-MCG tablet    Sig: Take 1 tablet by mouth daily.    Dispense:   3 Package    Refill:  4    Referral Orders     Ambulatory referral to Gastroenterology     Ambulatory referral to Orthopedic Surgery   Electronically signed by: Howard Pouch, [place]

## 2020-01-28 ENCOUNTER — Encounter: Payer: Self-pay | Admitting: Family Medicine

## 2020-01-28 DIAGNOSIS — M25562 Pain in left knee: Secondary | ICD-10-CM | POA: Insufficient documentation

## 2020-01-28 DIAGNOSIS — R002 Palpitations: Secondary | ICD-10-CM | POA: Insufficient documentation

## 2020-02-08 ENCOUNTER — Other Ambulatory Visit: Payer: Self-pay

## 2020-02-08 ENCOUNTER — Ambulatory Visit (INDEPENDENT_AMBULATORY_CARE_PROVIDER_SITE_OTHER): Payer: BC Managed Care – PPO | Admitting: Family Medicine

## 2020-02-08 ENCOUNTER — Encounter: Payer: Self-pay | Admitting: Family Medicine

## 2020-02-08 DIAGNOSIS — G8929 Other chronic pain: Secondary | ICD-10-CM | POA: Diagnosis not present

## 2020-02-08 DIAGNOSIS — M25562 Pain in left knee: Secondary | ICD-10-CM

## 2020-02-08 MED ORDER — MELOXICAM 15 MG PO TABS: 7.5000 mg | ORAL_TABLET | Freq: Every day | ORAL | 6 refills | Status: DC | PRN

## 2020-02-08 NOTE — Patient Instructions (Signed)
    Diagnosis:    - Possible lateral meniscus tear. - Early arthritis (chondromalacia patella) behind both kneecaps.   Treatment:  - Aleve 2 pills twice daily for 1-2 weeks and then as-needed (available over the counter).  Or  - Meloxicam for 1-2 weeks and then as needed (I sent in the Rx).   Long-term:  - Glucosamine sulfate 1,000 mg twice daily

## 2020-02-08 NOTE — Progress Notes (Signed)
Office Visit Note   Patient: Katessa Attridge           Date of Birth: 24-May-1971           MRN: [id number] Visit Date: 02/08/2020 Requested by: Ma Hillock, DO [id number] [place],  [place] [address] PCP: Ma Hillock, DO  Subjective: Chief Complaint  Patient presents with  . Left Knee - Pain    Patient comes in today for left knee pain. Pain for 1 year. Thinks she twisted knee. +Popping when bending. Not taking anything for pain.  Tired of knee hurting all the time.      HPI: He is here with left knee pain.  About a year ago she recalls having her left foot planted, she twisted her upper body to the left and felt something pop in the lateral aspect of her knee.  It has bothered her ever since then with certain activities.  No locking, but occasional popping.  Pain is almost always on the anterior lateral aspect.  She has not taken anything for the pain because it is not constant.  No previous problems with her knee.  She has a family history of arthritis in her father who had both knees replaced.               ROS:   All other systems were reviewed and are negative.  Objective: Vital Signs: There were no vitals taken for this visit.  Physical Exam:  General:  Alert and oriented, in no acute distress. Pulm:  Breathing unlabored. Psy:  Normal mood, congruent affect. Skin: No erythema or rash Knees: She has 2-3+ bilateral patellofemoral crepitus.  1+ effusion on the left, none on the right.  Negative patella apprehension test, negative patella compression test.  Lachman's feels solid.  She has lateral joint line tenderness with pain but no palpable click on McMurray's.  No laxity of the collateral ligaments.  Imaging: No results found.  Assessment & Plan: 1.  Chronic left knee pain, suspicious for lateral meniscus tear.  She has underlying bilateral chondromalacia patella. -Discussed options with her, she would like to try either meloxicam or Aleve for the next couple weeks,  followed by glucosamine long-term.  If symptoms persist, then could either inject one time with cortisone, or order x-rays and MRI scan followed by surgical consult if indicated.     Procedures: No procedures performed  No notes on file     PMFS History: Patient Active Problem List   Diagnosis Date Noted  . Chronic pain of left knee 01/28/2020  . Palpitations 01/28/2020  . Papanicolaou smear, as part of routine gynecological examination 10/29/2018  . Birth control counseling 09/24/2018  . Obesity (BMI 30-39.9) 09/24/2018   Past Medical History:  Diagnosis Date  . Allergies    Seasonal   . Blood in stool    Pt has hemorrhoids    Family History  Problem Relation Age of Onset  . Diabetes Mother   . Hypertension Mother   . Pancreatic cancer Father   . Non-Hodgkin's lymphoma Father   . Alzheimer's disease Maternal Grandmother   . Heart disease Maternal Grandmother   . Bladder Cancer Maternal Grandfather   . Heart disease Paternal Grandmother   . Kidney failure Paternal Grandfather     Past Surgical History:  Procedure Laterality Date  . BREAST BIOPSY Left   . EYE SURGERY Left 01/05/2011   Preventative Lattice Surgery    Social History   Occupational History  .  Not on file  Tobacco Use  . Smoking status: Never Smoker  . Smokeless tobacco: Never Used  Vaping Use  . Vaping Use: Never used  Substance and Sexual Activity  . Alcohol use: Never  . Drug use: Never  . Sexual activity: Yes    Partners: Male

## 2020-03-03 ENCOUNTER — Encounter: Payer: Self-pay | Admitting: Family Medicine

## 2020-03-16 DIAGNOSIS — Z131 Encounter for screening for diabetes mellitus: Secondary | ICD-10-CM | POA: Diagnosis not present

## 2020-03-16 DIAGNOSIS — Z0130 Encounter for examination of blood pressure without abnormal findings: Secondary | ICD-10-CM | POA: Diagnosis not present

## 2020-03-16 DIAGNOSIS — Z6830 Body mass index (BMI) 30.0-30.9, adult: Secondary | ICD-10-CM | POA: Diagnosis not present

## 2020-03-16 DIAGNOSIS — Z713 Dietary counseling and surveillance: Secondary | ICD-10-CM | POA: Diagnosis not present

## 2020-03-16 DIAGNOSIS — Z13220 Encounter for screening for lipoid disorders: Secondary | ICD-10-CM | POA: Diagnosis not present

## 2020-03-16 DIAGNOSIS — Z136 Encounter for screening for cardiovascular disorders: Secondary | ICD-10-CM | POA: Diagnosis not present

## 2020-03-16 LAB — BASIC METABOLIC PANEL: Glucose: 81

## 2020-03-16 LAB — LIPID PANEL
Cholesterol: 147 (ref 0–200)
HDL: 50 (ref 35–70)
LDL Cholesterol: 85
Triglycerides: 59 (ref 40–160)

## 2020-03-18 ENCOUNTER — Encounter: Payer: Self-pay | Admitting: Family Medicine

## 2020-03-28 ENCOUNTER — Encounter: Payer: Self-pay | Admitting: Gastroenterology

## 2020-03-28 ENCOUNTER — Ambulatory Visit (AMBULATORY_SURGERY_CENTER): Payer: Self-pay | Admitting: *Deleted

## 2020-03-28 ENCOUNTER — Other Ambulatory Visit: Payer: Self-pay

## 2020-03-28 VITALS — Ht 63.0 in | Wt 176.0 lb

## 2020-03-28 DIAGNOSIS — Z1211 Encounter for screening for malignant neoplasm of colon: Secondary | ICD-10-CM

## 2020-03-28 MED ORDER — SUTAB 1479-225-188 MG PO TABS: 1.0000 | ORAL_TABLET | Freq: Once | ORAL | 0 refills | Status: AC

## 2020-03-28 NOTE — Progress Notes (Signed)
Completed covid vaccine 12-04-19  Pt is aware that care partner will wait in the car during procedure; if they feel like they will be too hot or cold to wait in the car; they may wait in the 4 th floor lobby. Patient is aware to bring only one care partner. We want them to wear a mask (we do not have any that we can provide them), practice social distancing, and we will check their temperatures when they get here.  I did remind the patient that their care partner needs to stay in the parking lot the entire time and have a cell phone available, we will call them when the pt is ready for discharge. Patient will wear mask into building.   No egg allergy  Pt allergic to soybean pollen, but states she can eat soybean containing foods  No home oxygen use   No medications for weight loss taken  emmi information given- via MyChart  Pt denies constipation issues   No trouble with anesthesia, difficulty with moving neck or hx/fam hx of malignant hyperthermia per pt   Sutab code put into RX and paper copy given to pt to show pharmacy

## 2020-04-11 ENCOUNTER — Ambulatory Visit (AMBULATORY_SURGERY_CENTER): Payer: BC Managed Care – PPO | Admitting: Gastroenterology

## 2020-04-11 ENCOUNTER — Other Ambulatory Visit: Payer: Self-pay

## 2020-04-11 ENCOUNTER — Encounter: Payer: Self-pay | Admitting: Gastroenterology

## 2020-04-11 VITALS — BP 123/75 | HR 66 | Temp 98.4°F | Resp 14 | Ht 63.0 in | Wt 176.0 lb

## 2020-04-11 DIAGNOSIS — D125 Benign neoplasm of sigmoid colon: Secondary | ICD-10-CM

## 2020-04-11 DIAGNOSIS — Z1211 Encounter for screening for malignant neoplasm of colon: Secondary | ICD-10-CM | POA: Diagnosis not present

## 2020-04-11 MED ORDER — SODIUM CHLORIDE 0.9 % IV SOLN: 500.0000 mL | Freq: Once | INTRAVENOUS | Status: DC

## 2020-04-11 NOTE — Op Note (Signed)
[facility] [place] Patient Name: Traci Flynn Procedure Date: 04/11/2020 8:52 AM MRN: [id number] Endoscopist: Mauri Pole , MD Age: 49 Referring MD:  Date of Birth: 1970-11-09 Gender: Female Account #: [id number] Procedure:                Colonoscopy Indications:              Screening for colorectal malignant neoplasm Medicines:                Monitored Anesthesia Care Procedure:                Pre-Anesthesia Assessment:                           - Prior to the procedure, a History and Physical                            was performed, and patient medications and                            allergies were reviewed. The patient's tolerance of                            previous anesthesia was also reviewed. The risks                            and benefits of the procedure and the sedation                            options and risks were discussed with the patient.                            All questions were answered, and informed consent                            was obtained. Prior Anticoagulants: The patient has                            taken no previous anticoagulant or antiplatelet                            agents. ASA Grade Assessment: II - A patient with                            mild systemic disease. After reviewing the risks                            and benefits, the patient was deemed in                            satisfactory condition to undergo the procedure.                           After obtaining informed consent, the colonoscope  was passed under direct vision. Throughout the                            procedure, the patient's blood pressure, pulse, and                            oxygen saturations were monitored continuously. The                            Colonoscope was introduced through the anus and                            advanced to the the cecum, identified by                            appendiceal orifice and  ileocecal valve. The                            colonoscopy was performed without difficulty. The                            patient tolerated the procedure well. The quality                            of the bowel preparation was excellent. The                            ileocecal valve, appendiceal orifice, and rectum                            were photographed. Scope In: 8:57:50 AM Scope Out: 9:20:10 AM Scope Withdrawal Time: 0 hours 10 minutes 19 seconds  Total Procedure Duration: 0 hours 22 minutes 20 seconds  Findings:                 The perianal and digital rectal examinations were                            normal.                           A 5 mm polyp was found in the sigmoid colon. The                            polyp was sessile. The polyp was removed with a                            cold snare. Resection and retrieval were complete.                           Multiple small and large-mouthed diverticula were                            found in the sigmoid colon, descending colon,  transverse colon, ascending colon and cecum. There                            was evidence of an impacted diverticulum.                           Non-bleeding internal hemorrhoids were found during                            retroflexion. The hemorrhoids were medium-sized.                           The exam was otherwise without abnormality. Complications:            No immediate complications. Estimated Blood Loss:     Estimated blood loss was minimal. Impression:               - One 5 mm polyp in the sigmoid colon, removed with                            a cold snare. Resected and retrieved.                           - Moderate diverticulosis in the sigmoid colon, in                            the descending colon, in the transverse colon, in                            the ascending colon and in the cecum. There was                            evidence of an impacted  diverticulum.                           - Non-bleeding internal hemorrhoids.                           - The examination was otherwise normal. Recommendation:           - Patient has a contact number available for                            emergencies. The signs and symptoms of potential                            delayed complications were discussed with the                            patient. Return to normal activities tomorrow.                            Written discharge instructions were provided to the  patient.                           - Resume previous diet.                           - Continue present medications.                           - Await pathology results.                           - Repeat colonoscopy in 5-10 years for surveillance                            based on pathology results. Mauri Pole, MD 04/11/2020 9:28:27 AM This report has been signed electronically.

## 2020-04-11 NOTE — Progress Notes (Signed)
Called to room to assist during endoscopic procedure.  Patient ID and intended procedure confirmed with present staff. Received instructions for my participation in the procedure from the performing physician.  

## 2020-04-11 NOTE — Progress Notes (Signed)
Report to PACU, RN, vss, BBS= Clear.  

## 2020-04-11 NOTE — Progress Notes (Signed)
VS-CW  Pt's states no medical or surgical changes since previsit or office visit.  

## 2020-04-11 NOTE — Patient Instructions (Signed)
HANDOUTS PROVIDED ON: POLYPS, DIVERTICULOSIS, & HEMORRHOIDS  The polyp removed today have been sent for pathology.  The results can take 1-3 weeks to receive.  When your next colonoscopy should occur will be based on the pathology results.    You may resume your previous diet and medication schedule.  Thank you for allowing us to care for you today!!!   YOU HAD AN ENDOSCOPIC PROCEDURE TODAY AT THE [facility]:   Refer to the procedure report that was given to you for any specific questions about what was found during the examination.  If the procedure report does not answer your questions, please call your gastroenterologist to clarify.  If you requested that your care partner not be given the details of your procedure findings, then the procedure report has been included in a sealed envelope for you to review at your convenience later.  YOU SHOULD EXPECT: Some feelings of bloating in the abdomen. Passage of more gas than usual.  Walking can help get rid of the air that was put into your GI tract during the procedure and reduce the bloating. If you had a lower endoscopy (such as a colonoscopy or flexible sigmoidoscopy) you may notice spotting of blood in your stool or on the toilet paper. If you underwent a bowel prep for your procedure, you may not have a normal bowel movement for a few days.  Please Note:  You might notice some irritation and congestion in your nose or some drainage.  This is from the oxygen used during your procedure.  There is no need for concern and it should clear up in a day or so.  SYMPTOMS TO REPORT IMMEDIATELY:   Following lower endoscopy (colonoscopy or flexible sigmoidoscopy):  Excessive amounts of blood in the stool  Significant tenderness or worsening of abdominal pains  Swelling of the abdomen that is new, acute  Fever of 100F or higher  For urgent or emergent issues, a gastroenterologist can be reached at any hour by calling [phone]. Do  not use MyChart messaging for urgent concerns.    DIET:  We do recommend a small meal at first, but then you may proceed to your regular diet.  Drink plenty of fluids but you should avoid alcoholic beverages for 24 hours.  ACTIVITY:  You should plan to take it easy for the rest of today and you should NOT DRIVE or use heavy machinery until tomorrow (because of the sedation medicines used during the test).    FOLLOW UP: Our staff will call the number listed on your records 48-72 hours following your procedure to check on you and address any questions or concerns that you may have regarding the information given to you following your procedure. If we do not reach you, we will leave a message.  We will attempt to reach you two times.  During this call, we will ask if you have developed any symptoms of COVID 19. If you develop any symptoms (ie: fever, flu-like symptoms, shortness of breath, cough etc.) before then, please call [phone].  If you test positive for Covid 19 in the 2 weeks post procedure, please call and report this information to us.    If any biopsies were taken you will be contacted by phone or by letter within the next 1-3 weeks.  Please call us at [phone] if you have not heard about the biopsies in 3 weeks.    SIGNATURES/CONFIDENTIALITY: You and/or your care partner have signed paperwork which will   be entered into your electronic medical record.  These signatures attest to the fact that that the information above on your After Visit Summary has been reviewed and is understood.  Full responsibility of the confidentiality of this discharge information lies with you and/or your care-partner. 

## 2020-04-13 ENCOUNTER — Telehealth: Payer: Self-pay | Admitting: *Deleted

## 2020-04-13 NOTE — Telephone Encounter (Signed)
  Follow up Call-  Call back number 04/11/2020  Post procedure Call Back phone  # [phone]  Permission to leave phone message Yes  Some recent data might be hidden     Patient questions:  Do you have a fever, pain , or abdominal swelling? No. Pain Score  0 *  Have you tolerated food without any problems? Yes.    Have you been able to return to your normal activities? Yes.    Do you have any questions about your discharge instructions: Diet   No. Medications  No. Follow up visit  No.  Do you have questions or concerns about your Care? No.  Actions: * If pain score is 4 or above: 1. No action needed, pain <4.Have you developed a fever since your procedure? no  2.   Have you had an respiratory symptoms (SOB or cough) since your procedure? no  3.   Have you tested positive for COVID 19 since your procedure no  4.   Have you had any family members/close contacts diagnosed with the COVID 19 since your procedure?  no   If yes to any of these questions please route to Joylene John, RN and Joella Prince, RN

## 2020-04-19 ENCOUNTER — Encounter: Payer: Self-pay | Admitting: Gastroenterology

## 2020-12-16 ENCOUNTER — Other Ambulatory Visit (HOSPITAL_BASED_OUTPATIENT_CLINIC_OR_DEPARTMENT_OTHER): Payer: Self-pay | Admitting: Family Medicine

## 2020-12-16 DIAGNOSIS — Z1231 Encounter for screening mammogram for malignant neoplasm of breast: Secondary | ICD-10-CM

## 2020-12-19 ENCOUNTER — Ambulatory Visit (HOSPITAL_BASED_OUTPATIENT_CLINIC_OR_DEPARTMENT_OTHER)
Admission: RE | Admit: 2020-12-19 | Discharge: 2020-12-19 | Disposition: A | Discharge status: Home / Self Care | Payer: BC Managed Care – PPO | Source: Ambulatory Visit | Attending: Family Medicine | Admitting: Family Medicine

## 2020-12-19 ENCOUNTER — Other Ambulatory Visit: Payer: Self-pay

## 2020-12-19 DIAGNOSIS — Z1231 Encounter for screening mammogram for malignant neoplasm of breast: Secondary | ICD-10-CM | POA: Insufficient documentation

## 2021-03-10 DIAGNOSIS — Z6831 Body mass index (BMI) 31.0-31.9, adult: Secondary | ICD-10-CM | POA: Diagnosis not present

## 2021-03-10 DIAGNOSIS — Z713 Dietary counseling and surveillance: Secondary | ICD-10-CM | POA: Diagnosis not present

## 2021-03-10 DIAGNOSIS — Z13220 Encounter for screening for lipoid disorders: Secondary | ICD-10-CM | POA: Diagnosis not present

## 2021-03-10 DIAGNOSIS — Z131 Encounter for screening for diabetes mellitus: Secondary | ICD-10-CM | POA: Diagnosis not present

## 2021-03-10 DIAGNOSIS — Z136 Encounter for screening for cardiovascular disorders: Secondary | ICD-10-CM | POA: Diagnosis not present

## 2021-03-17 ENCOUNTER — Other Ambulatory Visit (INDEPENDENT_AMBULATORY_CARE_PROVIDER_SITE_OTHER): Payer: BC Managed Care – PPO

## 2021-03-17 ENCOUNTER — Encounter: Payer: Self-pay | Admitting: Family Medicine

## 2021-03-17 ENCOUNTER — Ambulatory Visit (INDEPENDENT_AMBULATORY_CARE_PROVIDER_SITE_OTHER): Payer: BC Managed Care – PPO | Admitting: Family Medicine

## 2021-03-17 ENCOUNTER — Other Ambulatory Visit: Payer: Self-pay

## 2021-03-17 VITALS — BP 129/80 | HR 72 | Temp 98.4°F | Ht 62.75 in | Wt 180.0 lb

## 2021-03-17 DIAGNOSIS — Z1159 Encounter for screening for other viral diseases: Secondary | ICD-10-CM

## 2021-03-17 DIAGNOSIS — E669 Obesity, unspecified: Secondary | ICD-10-CM

## 2021-03-17 DIAGNOSIS — Z0000 Encounter for general adult medical examination without abnormal findings: Secondary | ICD-10-CM

## 2021-03-17 DIAGNOSIS — Z131 Encounter for screening for diabetes mellitus: Secondary | ICD-10-CM

## 2021-03-17 DIAGNOSIS — Z793 Long term (current) use of hormonal contraceptives: Secondary | ICD-10-CM | POA: Diagnosis not present

## 2021-03-17 DIAGNOSIS — Z1231 Encounter for screening mammogram for malignant neoplasm of breast: Secondary | ICD-10-CM

## 2021-03-17 DIAGNOSIS — Z3041 Encounter for surveillance of contraceptive pills: Secondary | ICD-10-CM

## 2021-03-17 MED ORDER — LOW-OGESTREL 0.3-30 MG-MCG PO TABS: 1.0000 | ORAL_TABLET | Freq: Every day | ORAL | 3 refills | Status: DC

## 2021-03-17 NOTE — Patient Instructions (Addendum)
Traci Flynn for Labs today  [address] [place], [place], [place] [address] Health Maintenance, Female Adopting a healthy lifestyle and getting preventive care are important in promoting health and wellness. Ask your health care provider about: The right schedule for you to have regular tests and exams. Things you can do on your own to prevent diseases and keep yourself healthy. What should I know about diet, weight, and exercise? Eat a healthy diet  Eat a diet that includes plenty of vegetables, fruits, low-fat dairy products, and lean protein. Do not eat a lot of foods that are high in solid fats, added sugars, or sodium.  Maintain a healthy weight Body mass index (BMI) is used to identify weight problems. It estimates body fat based on height and weight. Your health care provider can help determineyour BMI and help you achieve or maintain a healthy weight. Get regular exercise Get regular exercise. This is one of the most important things you can do for your health. Most adults should: Exercise for at least 150 minutes each week. The exercise should increase your heart rate and make you sweat (moderate-intensity exercise). Do strengthening exercises at least twice a week. This is in addition to the moderate-intensity exercise. Spend less time sitting. Even light physical activity can be beneficial. Watch cholesterol and blood lipids Have your blood tested for lipids and cholesterol at 50 years of age, then havethis test every 5 years. Have your cholesterol levels checked more often if: Your lipid or cholesterol levels are high. You are older than 50 years of age. You are at high risk for heart disease. What should I know about cancer screening? Depending on your health history and family history, you may need to have cancer screening at various ages. This may include screening for: Breast cancer. Cervical cancer. Colorectal cancer. Skin cancer. Lung cancer. What should I know  about heart disease, diabetes, and high blood pressure? Blood pressure and heart disease High blood pressure causes heart disease and increases the risk of stroke. This is more likely to develop in people who have high blood pressure readings, are of African descent, or are overweight. Have your blood pressure checked: Every 3-5 years if you are 37-45 years of age. Every year if you are 41 years old or older. Diabetes Have regular diabetes screenings. This checks your fasting blood sugar level. Have the screening done: Once every three years after age 21 if you are at a normal weight and have a low risk for diabetes. More often and at a younger age if you are overweight or have a high risk for diabetes. What should I know about preventing infection? Hepatitis B If you have a higher risk for hepatitis B, you should be screened for this virus. Talk with your health care provider to find out if you are at risk forhepatitis B infection. Hepatitis C Testing is recommended for: Everyone born from [birth year] through 1965. Anyone with known risk factors for hepatitis C. Sexually transmitted infections (STIs) Get screened for STIs, including gonorrhea and chlamydia, if: You are sexually active and are younger than 50 years of age. You are older than 50 years of age and your health care provider tells you that you are at risk for this type of infection. Your sexual activity has changed since you were last screened, and you are at increased risk for chlamydia or gonorrhea. Ask your health care provider if you are at risk. Ask your health care provider about whether you are at high  risk for HIV. Your health care provider may recommend a prescription medicine to help prevent HIV infection. If you choose to take medicine to prevent HIV, you should first get tested for HIV. You should then be tested every 3 months for as long as you are taking the medicine. Pregnancy If you are about to stop having your period  (premenopausal) and you may become pregnant, seek counseling before you get pregnant. Take 400 to 800 micrograms (mcg) of folic acid every day if you become pregnant. Ask for birth control (contraception) if you want to prevent pregnancy. Osteoporosis and menopause Osteoporosis is a disease in which the bones lose minerals and strength with aging. This can result in bone fractures. If you are 33 years old or older, or if you are at risk for osteoporosis and fractures, ask your health care provider if you should: Be screened for bone loss. Take a calcium or vitamin D supplement to lower your risk of fractures. Be given hormone replacement therapy (HRT) to treat symptoms of menopause. Follow these instructions at home: Lifestyle Do not use any products that contain nicotine or tobacco, such as cigarettes, e-cigarettes, and chewing tobacco. If you need help quitting, ask your health care provider. Do not use street drugs. Do not share needles. Ask your health care provider for help if you need support or information about quitting drugs. Alcohol use Do not drink alcohol if: Your health care provider tells you not to drink. You are pregnant, may be pregnant, or are planning to become pregnant. If you drink alcohol: Limit how much you use to 0-1 drink a day. Limit intake if you are breastfeeding. Be aware of how much alcohol is in your drink. In the [place] one drink equals one 12 oz bottle of beer (355 mL), one 5 oz glass of wine (148 mL), or one 1 oz glass of hard liquor (44 mL). General instructions Schedule regular health, dental, and eye exams. Stay current with your vaccines. Tell your health care provider if: You often feel depressed. You have ever been abused or do not feel safe at home. Summary Adopting a healthy lifestyle and getting preventive care are important in promoting health and wellness. Follow your health care provider's instructions about healthy diet, exercising, and  getting tested or screened for diseases. Follow your health care provider's instructions on monitoring your cholesterol and blood pressure. This information is not intended to replace advice given to you by your health care provider. Make sure you discuss any questions you have with your healthcare provider. Document Revised: 08/06/2018 Document Reviewed: 08/06/2018 Elsevier Patient Education  2022 [employer].

## 2021-03-17 NOTE — Progress Notes (Signed)
This visit occurred during the SARS-CoV-2 public health emergency.  Safety protocols were in place, including screening questions prior to the visit, additional usage of staff PPE, and extensive cleaning of exam room while observing appropriate contact time as indicated for disinfecting solutions.    Patient ID: Traci Flynn, female  DOB: 10-17-1970, 50 y.o.   MRN: [id number] Patient Care Team    Relationship Specialty Notifications Start End  Ma Hillock, DO PCP - General Family Medicine  09/24/18     Chief Complaint  Patient presents with   Annual Exam    Pt is fasting;     Subjective: Traci Flynn is a 50 y.o.  Female  present for CPE. All past medical history, surgical history, allergies, family history, immunizations, medications and social history were updated in the electronic medical record today. All recent labs, ED visits and hospitalizations within the last year were reviewed.  Health maintenance:  Colonoscopy: no fhx.  04/11/2020- Dr. Silverio Decamp- 7 yr follow- polyps Mammogram: no fhx. completed: 11/2020-[facility]> order placed for next yr.  Cervical cancer screening: last pap: 2020,  5 yr f/u Immunizations: tdap 2019 UTD, Influenza  (encouraged yearly),COVID completed. Shingrix ok by nurse visit after 05/14/2019 if desired.  Infectious disease screening: HIV completed- hep C collected today. DEXA: routine  Assistive device: none Oxygen [id number] Patient has a Dental home. Hospitalizations/ED visits: reviewed     Depression screen [facility] 2/9 03/17/2021 01/26/2020 09/24/2018  Decreased Interest 0 0 0  Down, Depressed, Hopeless 0 0 0  PHQ - 2 Score 0 0 0   GAD 7 : Generalized Anxiety Score 09/24/2018  Nervous, Anxious, on Edge 0  Control/stop worrying 0  Worry too much - different things 0  Trouble relaxing 0  Restless 0  Easily annoyed or irritable 0  Afraid - awful might happen 0  Total GAD 7 Score 0  Anxiety Difficulty Not difficult at all    Immunization History   Administered Date(s) Administered   Influenza,inj,Quad PF,6+ Mos 06/17/2019   Influenza-Unspecified 06/17/2019   PFIZER(Purple Top)SARS-COV-2 Vaccination 11/20/2019, 12/14/2019, 07/27/2020   Tdap 09/30/2017   Past Medical History:  Diagnosis Date   Allergies    Seasonal    Allergy    Blood in stool    Pt has hemorrhoids   Allergies  Allergen Reactions   Corn Pollen     Tree pollen and ragweed   Hazel Tree Pollen  [Corylus] Itching   Soybean Extract Allergy Skin Test     Soybean pollen only- able to eat soybean containing food   Past Surgical History:  Procedure Laterality Date   BREAST BIOPSY Left    nipple   EYE SURGERY Left 01/05/2011   Preventative Lattice Surgery    Family History  Problem Relation Age of Onset   Diabetes Mother    Hypertension Mother    Pancreatic cancer Father    Non-Hodgkin's lymphoma Father    Alzheimer's disease Maternal Grandmother    Heart disease Maternal Grandmother    Bladder Cancer Maternal Grandfather    Heart disease Paternal Grandmother    Kidney failure Paternal Grandfather    Colon cancer Neg Hx    Esophageal cancer Neg Hx    Stomach cancer Neg Hx    Rectal cancer Neg Hx    Social History   Social History Narrative   Marital status/children/pets: married, 1 child   Education/employment: BS. Industrial/product designer:      -Wears a bicycle helmet riding a bike: Yes     -  smoke alarm in the home:Yes     - wears seatbelt: Yes     - Feels safe in their relationships: Yes    Allergies as of 03/17/2021       Reactions   Corn Pollen    Tree pollen and ragweed   Hazel Tree Pollen  [corylus] Itching   Soybean Extract Allergy Skin Test    Soybean pollen only- able to eat soybean containing food        Medication List        Accurate as of March 17, 2021 10:24 AM. If you have any questions, ask your nurse or doctor.          STOP taking these medications    meloxicam 15 MG tablet Commonly known as: MOBIC Stopped by:  Howard Pouch, DO       TAKE these medications    cetirizine 10 MG tablet Commonly known as: ZYRTEC Take 10 mg by mouth daily.   Low-Ogestrel 0.3-30 MG-MCG tablet Generic drug: norgestrel-ethinyl estradiol Take 1 tablet by mouth daily. Take 1 tablet by mouth daily. What changed: additional instructions Changed by: Howard Pouch, DO   multivitamin with minerals Tabs tablet Take 1 tablet by mouth daily.        All past medical history, surgical history, allergies, family history, immunizations andmedications were updated in the EMR today and reviewed under the history and medication portions of their EMR.     No results found for this or any previous visit (from the past 2160 hour(s)).  MM 3D SCREEN BREAST BILATERAL  Result Date: 12/20/2020 CLINICAL DATA:  Screening. EXAM: DIGITAL SCREENING BILATERAL MAMMOGRAM WITH TOMOSYNTHESIS AND CAD TECHNIQUE: Bilateral screening digital craniocaudal and mediolateral oblique mammograms were obtained. Bilateral screening digital breast tomosynthesis was performed. The images were evaluated with computer-aided detection. COMPARISON:  Previous exam(s). ACR Breast Density Category b: There are scattered areas of fibroglandular density. FINDINGS: There are no findings suspicious for malignancy. The images were evaluated with computer-aided detection. IMPRESSION: No mammographic evidence of malignancy. A result letter of this screening mammogram will be mailed directly to the patient. RECOMMENDATION: Screening mammogram in one year. (Code:SM-B-01Y) BI-RADS CATEGORY  1: Negative. Electronically Signed   By: Audie Pinto M.D.   On: 12/20/2020 08:43     ROS: 14 pt review of systems performed and negative (unless mentioned in an HPI)  Objective: BP 129/80   Pulse 72   Temp 98.4 F (36.9 C) (Oral)   Ht 5' 2.75" (1.594 m)   Wt 180 lb (81.6 kg)   SpO2 100%   BMI 32.14 kg/m  Gen: Afebrile. No acute distress. Nontoxic in appearance,  well-developed, well-nourished,  very pleasant female.  HENT: AT. [place]. Bilateral TM visualized and normal in appearance, normal external auditory canal. MMM, no oral lesions, adequate dentition. Bilateral nares within normal limits. Throat without erythema, ulcerations or exudates. no Cough on exam, no hoarseness on exam. Eyes:Pupils Equal Round Reactive to light, Extraocular movements intact,  Conjunctiva without redness, discharge or icterus. Neck/lymp/endocrine: Supple,no lymphadenopathy, no thyromegaly CV: RRR no murmur, no edema, +2/4 P posterior tibialis pulses.  Chest: CTAB, no wheeze, rhonchi or crackles. normal Respiratory effort. good Air movement. Abd: Soft. flat. NTND. BS present. no Masses palpated. No hepatosplenomegaly. No rebound tenderness or guarding. Skin: no rashes, purpura or petechiae. Warm and well-perfused. Skin intact. Neuro/Msk:  Normal gait. PERLA. EOMi. Alert. Oriented x3.  Cranial nerves II through XII intact. Muscle strength 5/5 upper/lower extremity. DTRs equal bilaterally. Psych: Normal  affect, dress and demeanor. Normal speech. Normal thought content and judgment.   No results found.  Assessment/plan: Traci Flynn is a 50 y.o. female present for CPE Obesity (BMI 30-39.9) Diet and exercise.  - Lipid panel; Future Encounter for birth control pills maintenance/Long term (current) use of hormonal contraceptives Pt was counseled on benefits vs risk of BCP after 50. She is a NONsmoker. UTD mam. No fhx of clots or breast ca.  - CBC with Differential/Platelet; Future - Comprehensive metabolic panel; Future - Lipid panel; Future - TSH; Future Diabetes mellitus screening - Hemoglobin A1c; Future Need for hepatitis C screening test - Hepatitis C antibody; Future Breast cancer screening by mammogram - MM 3D SCREEN BREAST BILATERAL; Future Routine general medical examination at a health care facility Colonoscopy: no fhx.  04/11/2020- Dr. Silverio Decamp- 7 yr follow-  polyps Mammogram: no fhx. completed: 11/2020-[facility]> order placed for next yr.  Cervical cancer screening: last pap: 2020,  5 yr f/u Immunizations: tdap 2019 UTD, Influenza  (encouraged yearly),COVID completed. Shingrix ok by nurse visit after 05/13/2021 if desired.  Infectious disease screening: HIV completed- hep C collected today. DEXA: routine  Patient was encouraged to exercise greater than 150 minutes a week. Patient was encouraged to choose a diet filled with fresh fruits and vegetables, and lean meats. AVS provided to patient today for education/recommendation on gender specific health and safety maintenance.  Return for CPE (30 min).   Orders Placed This Encounter  Procedures   MM 3D SCREEN BREAST BILATERAL   CBC with Differential/Platelet   Comprehensive metabolic panel   Hemoglobin A1c   Lipid panel   TSH   Hepatitis C antibody   Meds ordered this encounter  Medications   norgestrel-ethinyl estradiol (LOW-OGESTREL) 0.3-30 MG-MCG tablet    Sig: Take 1 tablet by mouth daily. Take 1 tablet by mouth daily.    Dispense:  90 tablet    Refill:  3    3 package dispense please,   Referral Orders  No referral(s) requested today     Electronically signed by: Howard Pouch, [place]

## 2021-03-20 LAB — COMPREHENSIVE METABOLIC PANEL
AG Ratio: 1.3 (calc) (ref 1.0–2.5)
ALT: 15 U/L (ref 6–29)
AST: 16 U/L (ref 10–35)
Albumin: 3.6 g/dL (ref 3.6–5.1)
Alkaline phosphatase (APISO): 42 U/L (ref 31–125)
BUN: 17 mg/dL (ref 7–25)
CO2: 25 mmol/L (ref 20–32)
Calcium: 8.7 mg/dL (ref 8.6–10.2)
Chloride: 105 mmol/L (ref 98–110)
Creat: 0.79 mg/dL (ref 0.50–0.99)
Globulin: 2.7 g/dL (calc) (ref 1.9–3.7)
Glucose, Bld: 77 mg/dL (ref 65–99)
Potassium: 3.9 mmol/L (ref 3.5–5.3)
Sodium: 138 mmol/L (ref 135–146)
Total Bilirubin: 0.5 mg/dL (ref 0.2–1.2)
Total Protein: 6.3 g/dL (ref 6.1–8.1)

## 2021-03-20 LAB — CBC WITH DIFFERENTIAL/PLATELET
Absolute Monocytes: 270 cells/uL (ref 200–950)
Basophils Absolute: 29 cells/uL (ref 0–200)
Basophils Relative: 0.6 %
Eosinophils Absolute: 98 cells/uL (ref 15–500)
Eosinophils Relative: 2 %
HCT: 42 % (ref 35.0–45.0)
Hemoglobin: 13.5 g/dL (ref 11.7–15.5)
Lymphs Abs: 1715 cells/uL (ref 850–3900)
MCH: 30.7 pg (ref 27.0–33.0)
MCHC: 32.1 g/dL (ref 32.0–36.0)
MCV: 95.5 fL (ref 80.0–100.0)
MPV: 11.6 fL (ref 7.5–12.5)
Monocytes Relative: 5.5 %
Neutro Abs: 2788 cells/uL (ref 1500–7800)
Neutrophils Relative %: 56.9 %
Platelets: 229 10*3/uL (ref 140–400)
RBC: 4.4 10*6/uL (ref 3.80–5.10)
RDW: 11.7 % (ref 11.0–15.0)
Total Lymphocyte: 35 %
WBC: 4.9 10*3/uL (ref 3.8–10.8)

## 2021-03-20 LAB — LIPID PANEL
Cholesterol: 147 mg/dL (ref <200)
HDL: 50 mg/dL (ref >=50)
LDL Cholesterol (Calc): 83 mg/dL (calc)
Non-HDL Cholesterol (Calc): 97 mg/dL (calc) (ref <130)
Total CHOL/HDL Ratio: 2.9 (calc) (ref <5.0)
Triglycerides: 67 mg/dL (ref <150)

## 2021-03-20 LAB — HEMOGLOBIN A1C
Hgb A1c MFr Bld: 5 % of total Hgb (ref <5.7)
Mean Plasma Glucose: 97 mg/dL
eAG (mmol/L): 5.4 mmol/L

## 2021-03-20 LAB — TSH: TSH: 1.64 mIU/L

## 2021-03-20 LAB — HEPATITIS C ANTIBODY
Hepatitis C Ab: NONREACTIVE
SIGNAL TO CUT-OFF: 0.01 (ref <1.00)

## 2021-05-15 ENCOUNTER — Ambulatory Visit: Payer: BC Managed Care – PPO

## 2021-05-16 ENCOUNTER — Other Ambulatory Visit: Payer: Self-pay

## 2021-05-16 ENCOUNTER — Ambulatory Visit (INDEPENDENT_AMBULATORY_CARE_PROVIDER_SITE_OTHER): Payer: BC Managed Care – PPO

## 2021-05-16 DIAGNOSIS — Z23 Encounter for immunization: Secondary | ICD-10-CM

## 2021-05-16 NOTE — Progress Notes (Signed)
Pt presented for start of shingles vaccine series, VO given 03/17/21: Shingrix ok by nurse visit after 05/13/2021 if desired. Pt given shingles #1 today, tolerated injected well.

## 2021-09-13 ENCOUNTER — Ambulatory Visit (INDEPENDENT_AMBULATORY_CARE_PROVIDER_SITE_OTHER): Payer: BC Managed Care – PPO

## 2021-09-13 ENCOUNTER — Other Ambulatory Visit: Payer: Self-pay

## 2021-09-13 DIAGNOSIS — Z23 Encounter for immunization: Secondary | ICD-10-CM

## 2021-12-20 ENCOUNTER — Inpatient Hospital Stay (HOSPITAL_BASED_OUTPATIENT_CLINIC_OR_DEPARTMENT_OTHER): Admission: RE | Admit: 2021-12-20 | Payer: BC Managed Care – PPO | Source: Ambulatory Visit

## 2021-12-26 ENCOUNTER — Ambulatory Visit (HOSPITAL_BASED_OUTPATIENT_CLINIC_OR_DEPARTMENT_OTHER)
Admission: RE | Admit: 2021-12-26 | Discharge: 2021-12-26 | Disposition: A | Discharge status: Home / Self Care | Payer: BC Managed Care – PPO | Source: Ambulatory Visit | Attending: Family Medicine | Admitting: Family Medicine

## 2021-12-26 ENCOUNTER — Encounter (HOSPITAL_BASED_OUTPATIENT_CLINIC_OR_DEPARTMENT_OTHER): Payer: Self-pay

## 2021-12-26 DIAGNOSIS — Z1231 Encounter for screening mammogram for malignant neoplasm of breast: Secondary | ICD-10-CM | POA: Insufficient documentation

## 2022-03-15 ENCOUNTER — Telehealth: Payer: Self-pay

## 2022-03-15 MED ORDER — LOW-OGESTREL 0.3-30 MG-MCG PO TABS: 1.0000 | ORAL_TABLET | Freq: Every day | ORAL | 3 refills | Status: DC

## 2022-03-15 NOTE — Telephone Encounter (Signed)
Patient refill request. Has CPE scheduled with Dr. Raoul Pitch on 8/9 but will be out of meds for [facility] before appt.  [facility]  norgestrel-ethinyl estradiol (LOW-OGESTREL) 0.3-30 MG-MCG tablet [[id number]]

## 2022-04-04 ENCOUNTER — Ambulatory Visit (INDEPENDENT_AMBULATORY_CARE_PROVIDER_SITE_OTHER): Payer: BC Managed Care – PPO | Admitting: Family Medicine

## 2022-04-04 ENCOUNTER — Encounter: Payer: Self-pay | Admitting: Family Medicine

## 2022-04-04 VITALS — BP 124/80 | HR 70 | Temp 98.2°F | Ht 62.75 in | Wt 189.0 lb

## 2022-04-04 DIAGNOSIS — Z6833 Body mass index (BMI) 33.0-33.9, adult: Secondary | ICD-10-CM

## 2022-04-04 DIAGNOSIS — Z793 Long term (current) use of hormonal contraceptives: Secondary | ICD-10-CM | POA: Diagnosis not present

## 2022-04-04 DIAGNOSIS — Z1231 Encounter for screening mammogram for malignant neoplasm of breast: Secondary | ICD-10-CM

## 2022-04-04 DIAGNOSIS — E669 Obesity, unspecified: Secondary | ICD-10-CM | POA: Diagnosis not present

## 2022-04-04 DIAGNOSIS — Z3041 Encounter for surveillance of contraceptive pills: Secondary | ICD-10-CM | POA: Diagnosis not present

## 2022-04-04 DIAGNOSIS — Z0000 Encounter for general adult medical examination without abnormal findings: Secondary | ICD-10-CM | POA: Diagnosis not present

## 2022-04-04 LAB — COMPREHENSIVE METABOLIC PANEL
ALT: 17 U/L (ref 0–35)
AST: 18 U/L (ref 0–37)
Albumin: 3.7 g/dL (ref 3.5–5.2)
Alkaline Phosphatase: 55 U/L (ref 39–117)
BUN: 18 mg/dL (ref 6–23)
CO2: 27 mEq/L (ref 19–32)
Calcium: 8.7 mg/dL (ref 8.4–10.5)
Chloride: 105 mEq/L (ref 96–112)
Creatinine, Ser: 0.73 mg/dL (ref 0.40–1.20)
GFR: 95.57 mL/min (ref >60.00)
Glucose, Bld: 84 mg/dL (ref 70–99)
Potassium: 4.4 mEq/L (ref 3.5–5.1)
Sodium: 137 mEq/L (ref 135–145)
Total Bilirubin: 0.6 mg/dL (ref 0.2–1.2)
Total Protein: 6.6 g/dL (ref 6.0–8.3)

## 2022-04-04 LAB — CBC
HCT: 41.2 % (ref 36.0–46.0)
Hemoglobin: 13.5 g/dL (ref 12.0–15.0)
MCHC: 32.9 g/dL (ref 30.0–36.0)
MCV: 93 fl (ref 78.0–100.0)
Platelets: 232 10*3/uL (ref 150.0–400.0)
RBC: 4.43 Mil/uL (ref 3.87–5.11)
RDW: 12.9 % (ref 11.5–15.5)
WBC: 4.3 10*3/uL (ref 4.0–10.5)

## 2022-04-04 LAB — LIPID PANEL
Cholesterol: 156 mg/dL (ref 0–200)
HDL: 49.9 mg/dL (ref >39.00)
LDL Cholesterol: 93 mg/dL (ref 0–99)
NonHDL: 105.67
Total CHOL/HDL Ratio: 3
Triglycerides: 65 mg/dL (ref 0.0–149.0)
VLDL: 13 mg/dL (ref 0.0–40.0)

## 2022-04-04 LAB — HEMOGLOBIN A1C: Hgb A1c MFr Bld: 5.5 % (ref 4.6–6.5)

## 2022-04-04 LAB — TSH: TSH: 1.44 u[IU]/mL (ref 0.35–5.50)

## 2022-04-04 MED ORDER — LOW-OGESTREL 0.3-30 MG-MCG PO TABS: 1.0000 | ORAL_TABLET | Freq: Every day | ORAL | 4 refills | Status: DC

## 2022-04-04 NOTE — Progress Notes (Signed)
Patient ID: Traci Flynn, female  DOB: 08/25/1971, 51 y.o.   MRN: [id number] Patient Care Team    Relationship Specialty Notifications Start End  Ma Hillock, DO PCP - General Family Medicine  09/24/18     Chief Complaint  Patient presents with   Annual Exam    Pt is not fasting    Subjective: Traci Flynn is a 52 y.o.  Female  present for CPE  All past medical history, surgical history, allergies, family history, immunizations, medications and social history were updated in the electronic medical record today. All recent labs, ED visits and hospitalizations within the last year were reviewed.  Health maintenance:  Colonoscopy: no fhx.  04/11/2020- Dr. Silverio Decamp- 7 yr follow- polyps Mammogram: no fhx. completed: 12/26/2021-[facility]> order placed for next yr.  Cervical cancer screening: last pap: 2020,  5 yr f/u Immunizations: tdap 2019 UTD, Influenza  (encouraged yearly),COVID completed. Shingrix series completed 08/2021 Infectious disease screening: HIV completed. hep C completed DEXA: routine screening Assistive device: none Oxygen SFK:CLEX Patient has a Dental home. Hospitalizations/ED visits: reviewed      04/04/2022    8:54 AM 03/17/2021    9:58 AM 01/26/2020   10:25 AM 09/24/2018    9:21 AM  Depression screen PHQ 2/9  Decreased Interest 0 0 0 0  Down, Depressed, Hopeless 0 0 0 0  PHQ - 2 Score 0 0 0 0      09/24/2018    9:21 AM  GAD 7 : Generalized Anxiety Score  Nervous, Anxious, on Edge 0  Control/stop worrying 0  Worry too much - different things 0  Trouble relaxing 0  Restless 0  Easily annoyed or irritable 0  Afraid - awful might happen 0  Total GAD 7 Score 0  Anxiety Difficulty Not difficult at all     Immunization History  Administered Date(s) Administered   Influenza,inj,Quad PF,6+ Mos 06/17/2019   Influenza-Unspecified 06/17/2019   PFIZER(Purple Top)SARS-COV-2 Vaccination 11/20/2019, 12/14/2019, 07/27/2020   Pfizer Covid-19 Vaccine Bivalent Booster  26yr & up 06/02/2021   Tdap 09/30/2017   Zoster Recombinat (Shingrix) 05/16/2021, 09/13/2021     Past Medical History:  Diagnosis Date   Allergies    Seasonal    Allergy    Blood in stool    Pt has hemorrhoids   Allergies  Allergen Reactions   Corn Pollen     Tree pollen and ragweed   Soybean Extract Allergy Skin Test     Soybean pollen only- able to eat soybean containing food   Past Surgical History:  Procedure Laterality Date   BREAST BIOPSY Left    nipple   EYE SURGERY Left 01/05/2011   Preventative Lattice Surgery    Family History  Problem Relation Age of Onset   Diabetes Mother    Hypertension Mother    Pancreatic cancer Father    Non-Hodgkin's lymphoma Father    Alzheimer's disease Maternal Grandmother    Heart disease Maternal Grandmother    Bladder Cancer Maternal Grandfather    Heart disease Paternal Grandmother    Kidney failure Paternal Grandfather    Colon cancer Neg Hx    Esophageal cancer Neg Hx    Stomach cancer Neg Hx    Rectal cancer Neg Hx    Social History   Social History Narrative   Marital status/children/pets: married, 1 child   Education/employment: BS. HIndustrial/product designer      -Wears a bicycle helmet riding a bike: Yes     -smoke  alarm in the home:Yes     - wears seatbelt: Yes     - Feels safe in their relationships: Yes    Allergies as of 04/04/2022       Reactions   Corn Pollen    Tree pollen and ragweed   Soybean Extract Allergy Skin Test    Soybean pollen only- able to eat soybean containing food        Medication List        Accurate as of April 04, 2022  9:14 AM. If you have any questions, ask your nurse or doctor.          cetirizine 10 MG tablet Commonly known as: ZYRTEC Take 10 mg by mouth daily.   Low-Ogestrel 0.3-30 MG-MCG tablet Generic drug: norgestrel-ethinyl estradiol Take 1 tablet by mouth daily. Take 1 tablet by mouth daily.   multivitamin with minerals Tabs tablet Take 1 tablet by mouth  daily.        All past medical history, surgical history, allergies, family history, immunizations andmedications were updated in the EMR today and reviewed under the history and medication portions of their EMR.     No results found for this or any previous visit (from the past 2160 hour(s)).  MM 3D SCREEN BREAST BILATERAL Result Date: 12/26/2021  BI-RADS CATEGORY  1: Negative.    ROS 14 pt review of systems performed and negative (unless mentioned in an HPI)  Objective: BP 124/80   Pulse 70   Temp 98.2 F (36.8 C) (Oral)   Ht 5' 2.75" (1.594 m)   Wt 189 lb (85.7 kg)   LMP 03/28/2022   SpO2 100%   BMI 33.75 kg/m  Physical Exam Vitals and nursing note reviewed.  Constitutional:      General: She is not in acute distress.    Appearance: Normal appearance. She is not ill-appearing or toxic-appearing.  HENT:     Head: Normocephalic and atraumatic.     Right Ear: Tympanic membrane, ear canal and external ear normal. There is no impacted cerumen.     Left Ear: Tympanic membrane, ear canal and external ear normal. There is no impacted cerumen.     Nose: No congestion or rhinorrhea.     Mouth/Throat:     Mouth: Mucous membranes are moist.     Pharynx: Oropharynx is clear. No oropharyngeal exudate or posterior oropharyngeal erythema.  Eyes:     General: No scleral icterus.       Right eye: No discharge.        Left eye: No discharge.     Extraocular Movements: Extraocular movements intact.     Conjunctiva/sclera: Conjunctivae normal.     Pupils: Pupils are equal, round, and reactive to light.  Cardiovascular:     Rate and Rhythm: Normal rate and regular rhythm.     Pulses: Normal pulses.     Heart sounds: Normal heart sounds. No murmur heard.    No friction rub. No gallop.  Pulmonary:     Effort: Pulmonary effort is normal. No respiratory distress.     Breath sounds: Normal breath sounds. No stridor. No wheezing, rhonchi or rales.  Chest:     Chest wall: No tenderness.   Abdominal:     General: Abdomen is flat. Bowel sounds are normal. There is no distension.     Palpations: Abdomen is soft. There is no mass.     Tenderness: There is no abdominal tenderness. There is no right CVA tenderness, left CVA tenderness,  guarding or rebound.     Hernia: No hernia is present.  Musculoskeletal:        General: No swelling, tenderness or deformity. Normal range of motion.     Cervical back: Normal range of motion and neck supple. No rigidity or tenderness.     Right lower leg: No edema.     Left lower leg: No edema.  Lymphadenopathy:     Cervical: No cervical adenopathy.  Skin:    General: Skin is warm and dry.     Coloration: Skin is not jaundiced or pale.     Findings: No bruising, erythema, lesion or rash.  Neurological:     General: No focal deficit present.     Mental Status: She is alert and oriented to person, place, and time. Mental status is at baseline.     Cranial Nerves: No cranial nerve deficit.     Sensory: No sensory deficit.     Motor: No weakness.     Coordination: Coordination normal.     Gait: Gait normal.     Deep Tendon Reflexes: Reflexes normal.  Psychiatric:        Mood and Affect: Mood normal.        Behavior: Behavior normal.        Thought Content: Thought content normal.        Judgment: Judgment normal.      No results found.  Assessment/plan: Margree Gimbel is a 51 y.o. female present for CPE  Encounter for birth control pills maintenance/Obesity (BMI 30-39.9)/ Long term current use of hormonal contraceptive - Lipid panel - CBC - Comprehensive metabolic panel - Hemoglobin A1c - Lipid panel - TSH Breast cancer screening by mammogram - MM 3D SCREEN BREAST BILATERAL; Future Routine general medical examination at a health care facility Colonoscopy: no fhx.  04/11/2020- Dr. Silverio Decamp- 7 yr follow- polyps Mammogram: no fhx. completed: 12/26/2021-[facility]> order placed for next yr.  Cervical cancer screening: last pap: 2020,  5 yr  f/u Immunizations: tdap 2019 UTD, Influenza  (encouraged yearly),COVID completed. Shingrix series completed 08/2021 Infectious disease screening: HIV completed. hep C completed DEXA: routine screening Patient was encouraged to exercise greater than 150 minutes a week. Patient was encouraged to choose a diet filled with fresh fruits and vegetables, and lean meats. AVS provided to patient today for education/recommendation on gender specific health and safety maintenance.  Return in about 1 year (around 04/06/2023) for cpe (20 min).  Orders Placed This Encounter  Procedures   MM 3D SCREEN BREAST BILATERAL   CBC   Comprehensive metabolic panel   Hemoglobin A1c   Lipid panel   TSH   No orders of the defined types were placed in this encounter.  Referral Orders  No referral(s) requested today     Electronically signed by: Howard Pouch, [place]

## 2022-04-04 NOTE — Patient Instructions (Addendum)

## 2023-01-22 ENCOUNTER — Ambulatory Visit (HOSPITAL_BASED_OUTPATIENT_CLINIC_OR_DEPARTMENT_OTHER)
Admission: RE | Admit: 2023-01-22 | Discharge: 2023-01-22 | Disposition: A | Discharge status: Home / Self Care | Payer: BC Managed Care – PPO | Source: Ambulatory Visit | Attending: Family Medicine | Admitting: Family Medicine

## 2023-01-22 ENCOUNTER — Encounter (HOSPITAL_BASED_OUTPATIENT_CLINIC_OR_DEPARTMENT_OTHER): Payer: Self-pay

## 2023-01-22 DIAGNOSIS — Z1231 Encounter for screening mammogram for malignant neoplasm of breast: Secondary | ICD-10-CM | POA: Diagnosis not present

## 2023-03-15 DIAGNOSIS — Z6835 Body mass index (BMI) 35.0-35.9, adult: Secondary | ICD-10-CM | POA: Diagnosis not present

## 2023-03-15 DIAGNOSIS — Z13220 Encounter for screening for lipoid disorders: Secondary | ICD-10-CM | POA: Diagnosis not present

## 2023-03-15 DIAGNOSIS — Z713 Dietary counseling and surveillance: Secondary | ICD-10-CM | POA: Diagnosis not present

## 2023-03-15 DIAGNOSIS — Z136 Encounter for screening for cardiovascular disorders: Secondary | ICD-10-CM | POA: Diagnosis not present

## 2023-03-15 DIAGNOSIS — Z131 Encounter for screening for diabetes mellitus: Secondary | ICD-10-CM | POA: Diagnosis not present

## 2023-06-06 ENCOUNTER — Telehealth: Payer: Self-pay | Admitting: Family Medicine

## 2023-06-06 MED ORDER — LOW-OGESTREL 0.3-30 MG-MCG PO TABS: 1.0000 | ORAL_TABLET | Freq: Every day | ORAL | 0 refills | Status: DC

## 2023-06-06 NOTE — Telephone Encounter (Signed)
Patient's prescription is expiring for norgestrel-ethinyl estradiol (LOW-OGESTREL) 0.3-30 MG-MCG tablet  and she will need it prior to her appointment on 11/15. She states that she has about 2 weeks left of her birth control. Pharmacy is still correct as the [facility] home delivery

## 2023-06-07 MED ORDER — LOW-OGESTREL 0.3-30 MG-MCG PO TABS: 1.0000 | ORAL_TABLET | Freq: Every day | ORAL | 0 refills | Status: DC

## 2023-06-07 NOTE — Telephone Encounter (Signed)
Patient reports that when medications are prescribed through [facility] it has to at least be 3 months, if a 3 month supply of norgestrel-ethinyl estradiol (LOW-OGESTREL) 0.3-30 MG-MCG tablet  can be called in.

## 2023-07-12 ENCOUNTER — Ambulatory Visit (INDEPENDENT_AMBULATORY_CARE_PROVIDER_SITE_OTHER): Payer: BC Managed Care – PPO | Admitting: Family Medicine

## 2023-07-12 ENCOUNTER — Encounter: Payer: Self-pay | Admitting: Family Medicine

## 2023-07-12 VITALS — BP 134/88 | HR 67 | Temp 98.3°F | Ht 63.78 in | Wt 206.4 lb

## 2023-07-12 DIAGNOSIS — Z13220 Encounter for screening for lipoid disorders: Secondary | ICD-10-CM | POA: Diagnosis not present

## 2023-07-12 DIAGNOSIS — Z1231 Encounter for screening mammogram for malignant neoplasm of breast: Secondary | ICD-10-CM | POA: Diagnosis not present

## 2023-07-12 DIAGNOSIS — Z0001 Encounter for general adult medical examination with abnormal findings: Secondary | ICD-10-CM

## 2023-07-12 DIAGNOSIS — E669 Obesity, unspecified: Secondary | ICD-10-CM | POA: Diagnosis not present

## 2023-07-12 DIAGNOSIS — Z3041 Encounter for surveillance of contraceptive pills: Secondary | ICD-10-CM | POA: Diagnosis not present

## 2023-07-12 DIAGNOSIS — Z0000 Encounter for general adult medical examination without abnormal findings: Secondary | ICD-10-CM

## 2023-07-12 LAB — LIPID PANEL
Cholesterol: 171 mg/dL (ref 0–200)
HDL: 49.8 mg/dL (ref >39.00)
LDL Cholesterol: 107 mg/dL — ABNORMAL HIGH (ref 0–99)
NonHDL: 121.6
Total CHOL/HDL Ratio: 3
Triglycerides: 73 mg/dL (ref 0.0–149.0)
VLDL: 14.6 mg/dL (ref 0.0–40.0)

## 2023-07-12 LAB — COMPREHENSIVE METABOLIC PANEL
ALT: 16 U/L (ref 0–35)
AST: 16 U/L (ref 0–37)
Albumin: 3.8 g/dL (ref 3.5–5.2)
Alkaline Phosphatase: 54 U/L (ref 39–117)
BUN: 17 mg/dL (ref 6–23)
CO2: 27 meq/L (ref 19–32)
Calcium: 8.7 mg/dL (ref 8.4–10.5)
Chloride: 104 meq/L (ref 96–112)
Creatinine, Ser: 0.76 mg/dL (ref 0.40–1.20)
GFR: 90.26 mL/min (ref >60.00)
Glucose, Bld: 78 mg/dL (ref 70–99)
Potassium: 4.3 meq/L (ref 3.5–5.1)
Sodium: 138 meq/L (ref 135–145)
Total Bilirubin: 0.5 mg/dL (ref 0.2–1.2)
Total Protein: 6.9 g/dL (ref 6.0–8.3)

## 2023-07-12 LAB — CBC
HCT: 43.7 % (ref 36.0–46.0)
Hemoglobin: 14 g/dL (ref 12.0–15.0)
MCHC: 32 g/dL (ref 30.0–36.0)
MCV: 95 fL (ref 78.0–100.0)
Platelets: 288 10*3/uL (ref 150.0–400.0)
RBC: 4.6 Mil/uL (ref 3.87–5.11)
RDW: 13.5 % (ref 11.5–15.5)
WBC: 4.5 10*3/uL (ref 4.0–10.5)

## 2023-07-12 LAB — TSH: TSH: 2.16 u[IU]/mL (ref 0.35–5.50)

## 2023-07-12 LAB — HEMOGLOBIN A1C: Hgb A1c MFr Bld: 5.3 % (ref 4.6–6.5)

## 2023-07-12 MED ORDER — LOW-OGESTREL 0.3-30 MG-MCG PO TABS: 1.0000 | ORAL_TABLET | Freq: Every day | ORAL | 4 refills | Status: DC

## 2023-07-12 NOTE — Progress Notes (Signed)
Patient ID: Traci Flynn, female  DOB: 09-04-1970, 52 y.o.   MRN: [id number] Patient Care Team    Relationship Specialty Notifications Start End  Natalia Leatherwood, DO PCP - General Family Medicine  09/24/18     Chief Complaint  Patient presents with   Annual Exam    Pt is fasting    Subjective: Traci Flynn is a 52 y.o.  Female  present for CPE  All past medical history, surgical history, allergies, family history, immunizations, medications and social history were updated in the electronic medical record today. All recent labs, ED visits and hospitalizations within the last year were reviewed.  Health maintenance:  Colonoscopy: no fhx.  04/11/2020- Dr. Lavon Paganini- 7 yr follow- polyps Mammogram: no fhx. completed: 01/22/2023 [facility]> order placed for next yr.  Cervical cancer screening: last pap: 2020,  5 yr f/u, PCP Immunizations: tdap 2019 UTD, Influenza utd 2024 encouraged yearly),, Shingrix series completed  Infectious disease screening: HIV completed. hep C completed DEXA: routine screening at 60 Assistive device: none Oxygen [id number] Patient has a Dental home. Hospitalizations/ED visits: Reviewed      07/12/2023   10:22 AM 04/04/2022    8:54 AM 03/17/2021    9:58 AM 01/26/2020   10:25 AM 09/24/2018    9:21 AM  Depression screen PHQ 2/9  Decreased Interest 0 0 0 0 0  Down, Depressed, Hopeless 0 0 0 0 0  PHQ - 2 Score 0 0 0 0 0      09/24/2018    9:21 AM  GAD 7 : Generalized Anxiety Score  Nervous, Anxious, on Edge 0  Control/stop worrying 0  Worry too much - different things 0  Trouble relaxing 0  Restless 0  Easily annoyed or irritable 0  Afraid - awful might happen 0  Total GAD 7 Score 0  Anxiety Difficulty Not difficult at all     Immunization History  Administered Date(s) Administered   Influenza,inj,Quad PF,6+ Mos 06/17/2019   Influenza-Unspecified 06/17/2019, 06/19/2023   PFIZER(Purple Top)SARS-COV-2 Vaccination 11/20/2019, 12/14/2019, 07/27/2020   Pfizer  Covid-19 Vaccine Bivalent Booster 49yrs & up 06/02/2021   Tdap 09/30/2017   Unspecified SARS-COV-2 Vaccination 07/08/2023   Zoster Recombinant(Shingrix) 05/16/2021, 09/13/2021     Past Medical History:  Diagnosis Date   Allergies    Seasonal    Allergy    Blood in stool    Pt has hemorrhoids   Allergies  Allergen Reactions   Corn Pollen     Tree pollen and ragweed   Soybean Extract Allergy Skin Test     Soybean pollen only- able to eat soybean containing food   Past Surgical History:  Procedure Laterality Date   BREAST BIOPSY Left    nipple   EYE SURGERY Left 01/05/2011   Preventative Lattice Surgery    Family History  Problem Relation Age of Onset   Diabetes Mother    Hypertension Mother    Pancreatic cancer Father    Non-Hodgkin's lymphoma Father    Alzheimer's disease Maternal Grandmother    Heart disease Maternal Grandmother    Bladder Cancer Maternal Grandfather    Heart disease Paternal Grandmother    Kidney failure Paternal Grandfather    Colon cancer Neg Hx    Esophageal cancer Neg Hx    Stomach cancer Neg Hx    Rectal cancer Neg Hx    Social History   Social History Narrative   Marital status/children/pets: married, 1 child   Education/employment: BS. Homemaker   Safety:      -  Wears a bicycle helmet riding a bike: Yes     -smoke alarm in the home:Yes     - wears seatbelt: Yes     - Feels safe in their relationships: Yes    Allergies as of 07/12/2023       Reactions   Corn Pollen    Tree pollen and ragweed   Soybean Extract Allergy Skin Test    Soybean pollen only- able to eat soybean containing food        Medication List        Accurate as of July 12, 2023 10:28 AM. If you have any questions, ask your nurse or doctor.          cetirizine 10 MG tablet Commonly known as: ZYRTEC Take 10 mg by mouth daily.   Low-Ogestrel 0.3-30 MG-MCG tablet Generic drug: norgestrel-ethinyl estradiol Take 1 tablet by mouth daily. Take 1  tablet by mouth daily.   multivitamin with minerals Tabs tablet Take 1 tablet by mouth daily.        All past medical history, surgical history, allergies, family history, immunizations andmedications were updated in the EMR today and reviewed under the history and medication portions of their EMR.     No results found for this or any previous visit (from the past 2160 hour(s)).    ROS 14 pt review of systems performed and negative (unless mentioned in an HPI)  Objective: BP 134/88   Pulse 67   Temp 98.3 F (36.8 C)   Ht 5' 3.78" (1.62 m)   Wt 206 lb 6.4 oz (93.6 kg)   SpO2 98%   BMI 35.67 kg/m  Physical Exam Vitals and nursing note reviewed.  Constitutional:      General: She is not in acute distress.    Appearance: Normal appearance. She is not ill-appearing or toxic-appearing.  HENT:     Head: Normocephalic and atraumatic.     Right Ear: Tympanic membrane, ear canal and external ear normal. There is no impacted cerumen.     Left Ear: Tympanic membrane, ear canal and external ear normal. There is no impacted cerumen.     Nose: No congestion or rhinorrhea.     Mouth/Throat:     Mouth: Mucous membranes are moist.     Pharynx: Oropharynx is clear. No oropharyngeal exudate or posterior oropharyngeal erythema.  Eyes:     General: No scleral icterus.       Right eye: No discharge.        Left eye: No discharge.     Extraocular Movements: Extraocular movements intact.     Conjunctiva/sclera: Conjunctivae normal.     Pupils: Pupils are equal, round, and reactive to light.  Cardiovascular:     Rate and Rhythm: Normal rate and regular rhythm.     Pulses: Normal pulses.     Heart sounds: Normal heart sounds. No murmur heard.    No friction rub. No gallop.  Pulmonary:     Effort: Pulmonary effort is normal. No respiratory distress.     Breath sounds: Normal breath sounds. No stridor. No wheezing, rhonchi or rales.  Chest:     Chest wall: No tenderness.  Abdominal:      General: Abdomen is flat. Bowel sounds are normal. There is no distension.     Palpations: Abdomen is soft. There is no mass.     Tenderness: There is no abdominal tenderness. There is no right CVA tenderness, left CVA tenderness, guarding or rebound.  Hernia: No hernia is present.  Musculoskeletal:        General: No swelling, tenderness or deformity. Normal range of motion.     Cervical back: Normal range of motion and neck supple. No rigidity or tenderness.     Right lower leg: No edema.     Left lower leg: No edema.  Lymphadenopathy:     Cervical: No cervical adenopathy.  Skin:    General: Skin is warm and dry.     Coloration: Skin is not jaundiced or pale.     Findings: No bruising, erythema, lesion or rash.  Neurological:     General: No focal deficit present.     Mental Status: She is alert and oriented to person, place, and time. Mental status is at baseline.     Cranial Nerves: No cranial nerve deficit.     Sensory: No sensory deficit.     Motor: No weakness.     Coordination: Coordination normal.     Gait: Gait normal.     Deep Tendon Reflexes: Reflexes normal.  Psychiatric:        Mood and Affect: Mood normal.        Behavior: Behavior normal.        Thought Content: Thought content normal.        Judgment: Judgment normal.      No results found.  Assessment/plan: Traci Flynn is a 52 y.o. female present for CPE  Routine general medical examination at a health care facility Patient was encouraged to exercise greater than 150 minutes a week. Patient was encouraged to choose a diet filled with fresh fruits and vegetables, and lean meats. AVS provided to patient today for education/recommendation on gender specific health and safety maintenance. Colonoscopy: no fhx.  04/11/2020- Dr. Lavon Paganini- 7 yr follow- polyps Mammogram: no fhx. completed: 01/22/2023 [facility]> order placed for next yr.  Cervical cancer screening: last pap: 2020,  5 yr f/u, PCP Immunizations: tdap 2019  UTD, Influenza UTD 2024 encouraged yearly),, Shingrix series completed  Infectious disease screening: HIV completed. hep C completed DEXA: routine screening at 60 - CBC - Comprehensive metabolic panel - Hemoglobin A1c - Lipid panel - TSH  Encounter for birth control pills maintenance Continue BCP daily if desired.  Would encourage her to eventually consider dc d/t age  (BMI 30-39.9)/Lipid screening - Lipid panel Breast cancer screening by mammogram - MM 3D SCREENING MAMMOGRAM BILATERAL BREAST; Future   Return in about 1 year (around 07/12/2024) for cpe (40 min) with pap.  Orders Placed This Encounter  Procedures   MM 3D SCREENING MAMMOGRAM BILATERAL BREAST   CBC   Comprehensive metabolic panel   Hemoglobin A1c   Lipid panel   TSH   Meds ordered this encounter  Medications   norgestrel-ethinyl estradiol (LOW-OGESTREL) 0.3-30 MG-MCG tablet    Sig: Take 1 tablet by mouth daily. Take 1 tablet by mouth daily.    Dispense:  90 tablet    Refill:  4   Referral Orders  No referral(s) requested today     Electronically signed by: Felix Pacini, DO [facility] [place]

## 2023-07-12 NOTE — Patient Instructions (Addendum)
Return in about 1 year (around 07/12/2024) for cpe (40 min) with pap.        Great to see you today.  I have refilled the medication(s) we provide.   If labs were collected or images ordered, we will inform you of  results once we have received them and reviewed. We will contact you either by echart message, or telephone call.  Please give ample time to the testing facility, and our office to run,  receive and review results. Please do not call inquiring of results, even if you can see them in your chart. We will contact you as soon as we are able. If it has been over 1 week since the test was completed, and you have not yet heard from [place], then please call us.    - echart message- for normal results that have been seen by the patient already.   - telephone call: abnormal results or if patient has not viewed results in their echart.  If a referral to a specialist was entered for you, please call us in 2 weeks if you have not heard from the specialist office to schedule.

## 2023-12-10 IMAGING — MG MM DIGITAL SCREENING BILAT W/ TOMO AND CAD
8 series · 8 of 24 positions shown · non-contrast
Comparison: Multiple prior exams.

CLINICAL DATA: Screening.

EXAM:
DIGITAL SCREENING BILATERAL MAMMOGRAM WITH TOMOSYNTHESIS AND CAD
TECHNIQUE: Bilateral screening digital craniocaudal and mediolateral oblique
mammograms were obtained. Bilateral screening digital breast
tomosynthesis was performed. The images were evaluated with
computer-aided detection.

[L CC synth-2D]
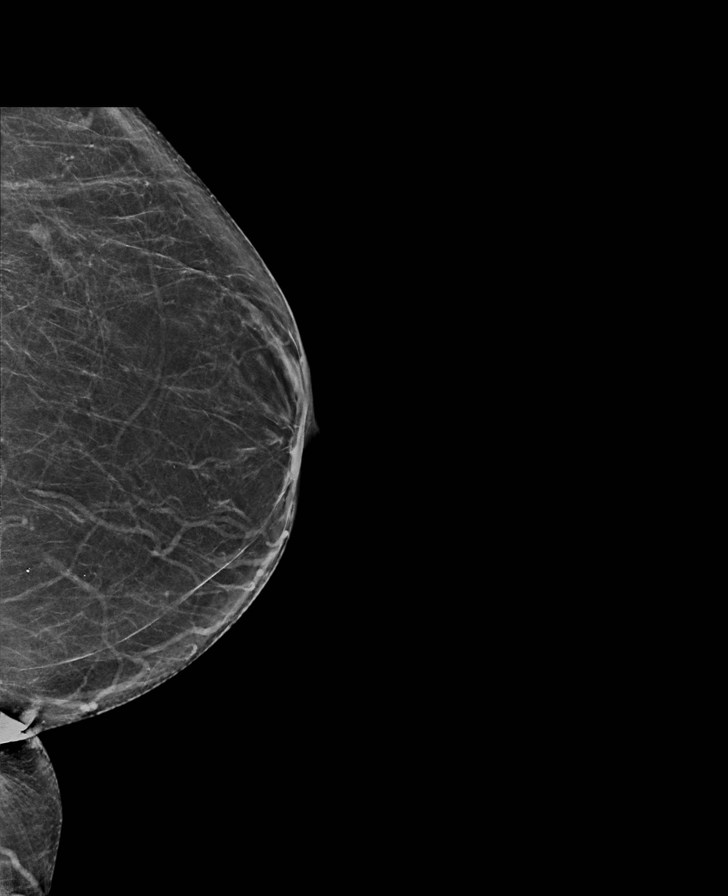

[L MLO synth-2D]
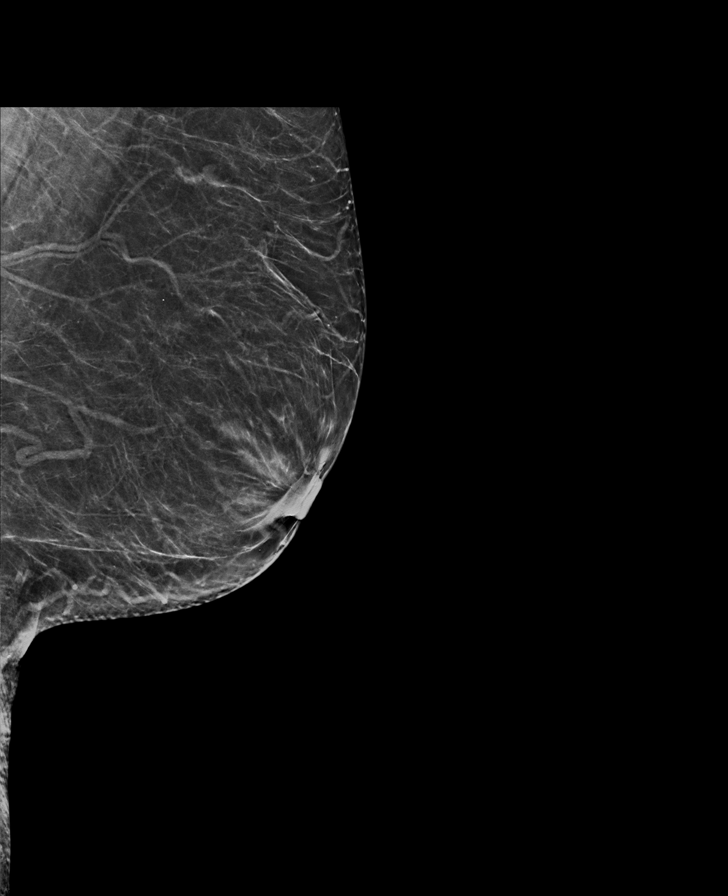

[R MLO synth-2D]
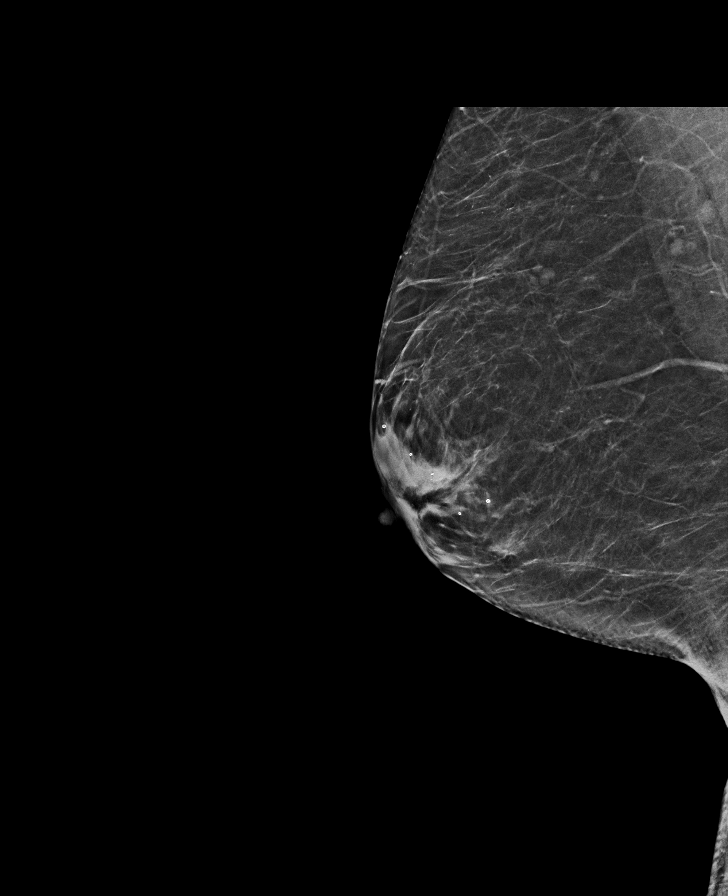

[R CC synth-2D]
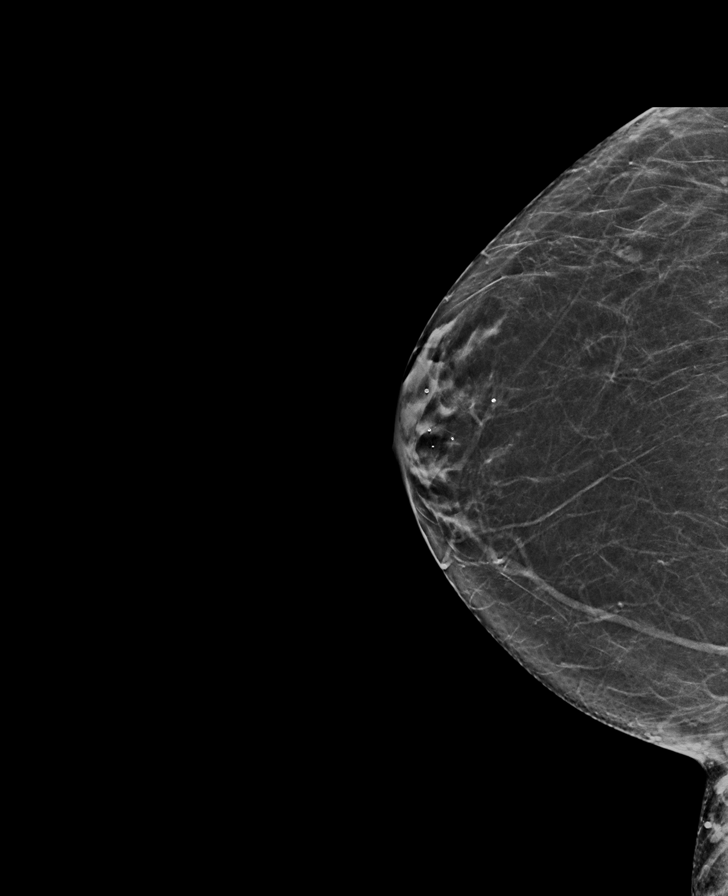

[L MLO tomo · tomo slice 32/63.0]
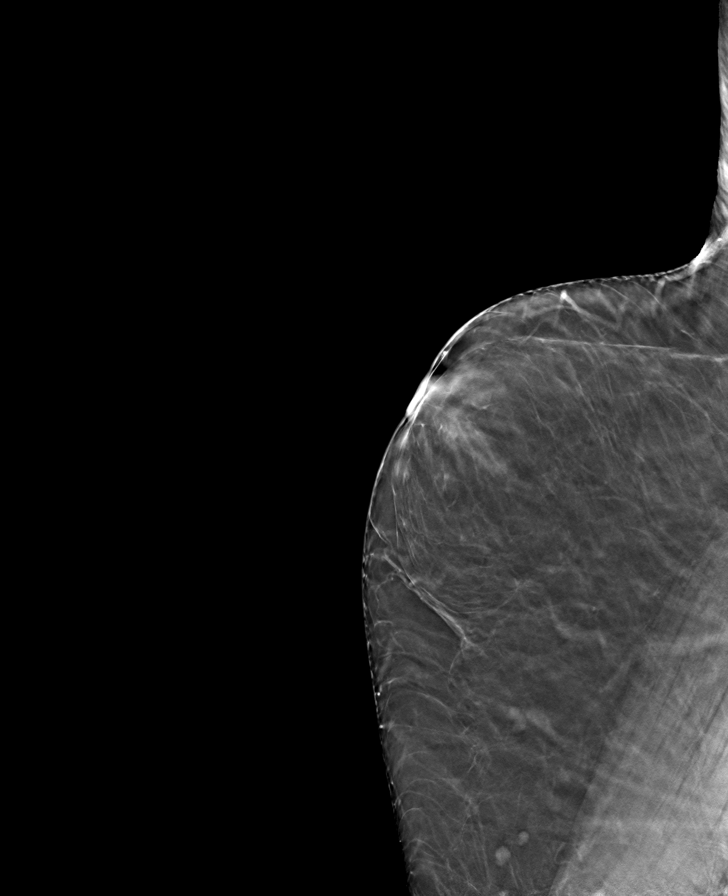

[L CC tomo · tomo slice 33/66.0]
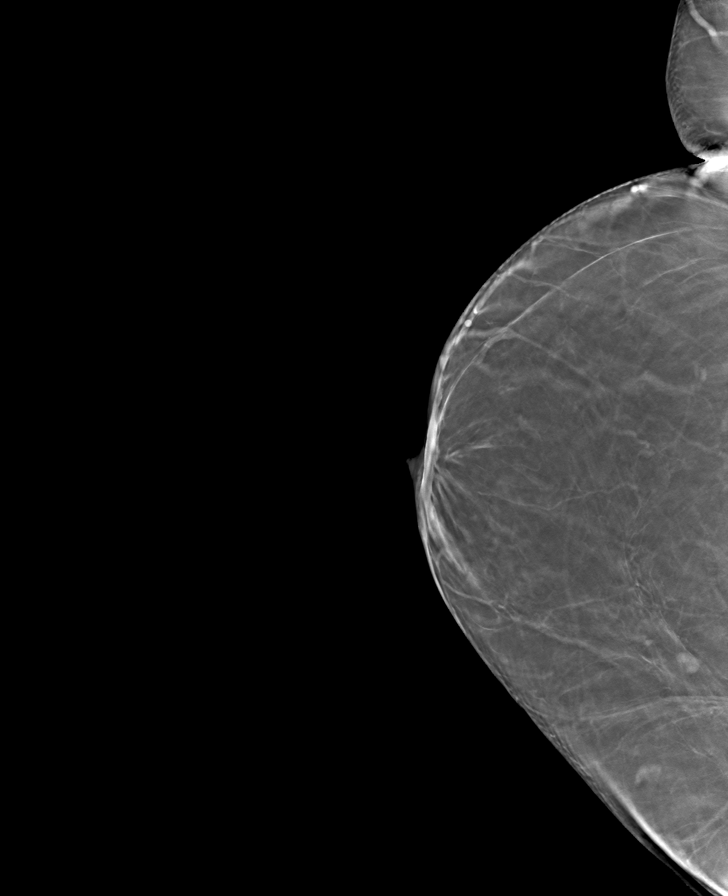

[R CC tomo · tomo slice 31/61.0]
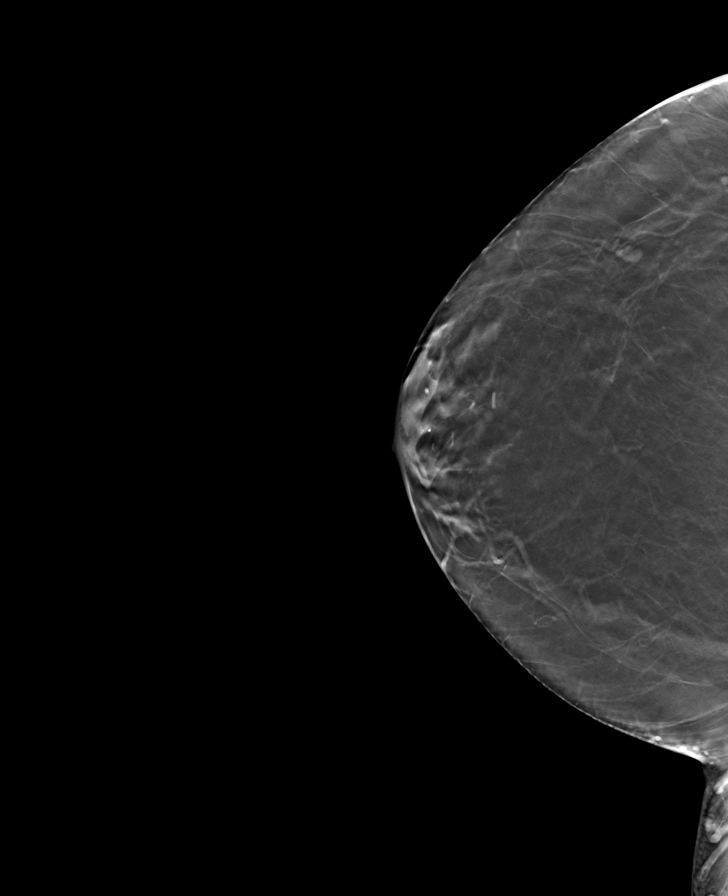

[R MLO tomo · tomo slice 31/61.0]
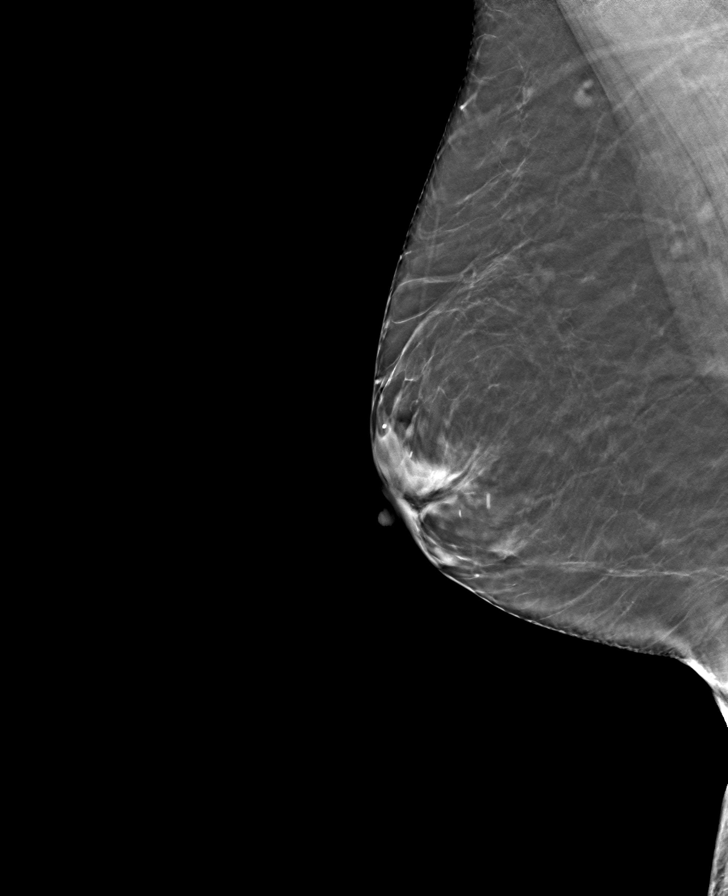

[8 of 24 positions shown; findings below may reference images not displayed]

ACR Breast Density Category b: There are scattered areas of
fibroglandular density.
FINDINGS: There are no findings suspicious for malignancy.
IMPRESSION: No mammographic evidence of malignancy. A result letter of this
screening mammogram will be mailed directly to the patient.

RECOMMENDATION:
Screening mammogram in one year. (Code:[id number])

BI-RADS CATEGORY  1: Negative.

## 2023-12-11 ENCOUNTER — Telehealth (HOSPITAL_BASED_OUTPATIENT_CLINIC_OR_DEPARTMENT_OTHER): Payer: Self-pay

## 2024-01-23 ENCOUNTER — Encounter (HOSPITAL_BASED_OUTPATIENT_CLINIC_OR_DEPARTMENT_OTHER): Payer: Self-pay

## 2024-01-23 ENCOUNTER — Ambulatory Visit (HOSPITAL_BASED_OUTPATIENT_CLINIC_OR_DEPARTMENT_OTHER)
Admission: RE | Admit: 2024-01-23 | Discharge: 2024-01-23 | Disposition: A | Discharge status: Home / Self Care | Source: Ambulatory Visit | Attending: Family Medicine | Admitting: Family Medicine

## 2024-01-23 DIAGNOSIS — Z1231 Encounter for screening mammogram for malignant neoplasm of breast: Secondary | ICD-10-CM | POA: Diagnosis not present

## 2024-01-28 ENCOUNTER — Ambulatory Visit: Payer: Self-pay | Admitting: Family Medicine

## 2024-03-25 DIAGNOSIS — Z13220 Encounter for screening for lipoid disorders: Secondary | ICD-10-CM | POA: Diagnosis not present

## 2024-03-25 DIAGNOSIS — Z6837 Body mass index (BMI) 37.0-37.9, adult: Secondary | ICD-10-CM | POA: Diagnosis not present

## 2024-06-09 NOTE — Telephone Encounter (Signed)
 No further action needed at this time.

## 2024-07-01 ENCOUNTER — Encounter: Admitting: Family Medicine

## 2024-07-29 ENCOUNTER — Ambulatory Visit: Admitting: Family Medicine

## 2024-07-29 ENCOUNTER — Other Ambulatory Visit (HOSPITAL_COMMUNITY)
Admission: RE | Admit: 2024-07-29 | Discharge: 2024-07-29 | Disposition: A | Discharge status: Home / Self Care | Source: Ambulatory Visit | Attending: Family Medicine | Admitting: Family Medicine

## 2024-07-29 ENCOUNTER — Encounter: Payer: Self-pay | Admitting: Family Medicine

## 2024-07-29 VITALS — BP 124/80 | HR 83 | Temp 98.2°F | Ht 63.0 in | Wt 201.6 lb

## 2024-07-29 DIAGNOSIS — Z01419 Encounter for gynecological examination (general) (routine) without abnormal findings: Secondary | ICD-10-CM | POA: Diagnosis not present

## 2024-07-29 DIAGNOSIS — Z3041 Encounter for surveillance of contraceptive pills: Secondary | ICD-10-CM

## 2024-07-29 DIAGNOSIS — E669 Obesity, unspecified: Secondary | ICD-10-CM | POA: Diagnosis not present

## 2024-07-29 DIAGNOSIS — Z793 Long term (current) use of hormonal contraceptives: Secondary | ICD-10-CM

## 2024-07-29 DIAGNOSIS — Z0000 Encounter for general adult medical examination without abnormal findings: Secondary | ICD-10-CM

## 2024-07-29 DIAGNOSIS — Z23 Encounter for immunization: Secondary | ICD-10-CM | POA: Diagnosis not present

## 2024-07-29 DIAGNOSIS — Z1211 Encounter for screening for malignant neoplasm of colon: Secondary | ICD-10-CM

## 2024-07-29 DIAGNOSIS — Z13220 Encounter for screening for lipoid disorders: Secondary | ICD-10-CM | POA: Diagnosis not present

## 2024-07-29 DIAGNOSIS — Z131 Encounter for screening for diabetes mellitus: Secondary | ICD-10-CM

## 2024-07-29 DIAGNOSIS — Z1231 Encounter for screening mammogram for malignant neoplasm of breast: Secondary | ICD-10-CM

## 2024-07-29 DIAGNOSIS — Z800 Family history of malignant neoplasm of digestive organs: Secondary | ICD-10-CM | POA: Insufficient documentation

## 2024-07-29 LAB — HEMOGLOBIN A1C: Hgb A1c MFr Bld: 5.4 % (ref 4.6–6.5)

## 2024-07-29 LAB — LIPID PANEL
Cholesterol: 161 mg/dL (ref 0–200)
HDL: 48.5 mg/dL (ref >39.00)
LDL Cholesterol: 99 mg/dL (ref 0–99)
NonHDL: 112.16
Total CHOL/HDL Ratio: 3
Triglycerides: 68 mg/dL (ref 0.0–149.0)
VLDL: 13.6 mg/dL (ref 0.0–40.0)

## 2024-07-29 LAB — COMPREHENSIVE METABOLIC PANEL WITH GFR
ALT: 20 U/L (ref 0–35)
AST: 19 U/L (ref 0–37)
Albumin: 4 g/dL (ref 3.5–5.2)
Alkaline Phosphatase: 56 U/L (ref 39–117)
BUN: 17 mg/dL (ref 6–23)
CO2: 23 meq/L (ref 19–32)
Calcium: 8.8 mg/dL (ref 8.4–10.5)
Chloride: 103 meq/L (ref 96–112)
Creatinine, Ser: 0.79 mg/dL (ref 0.40–1.20)
GFR: 85.53 mL/min (ref >60.00)
Glucose, Bld: 80 mg/dL (ref 70–99)
Potassium: 3.9 meq/L (ref 3.5–5.1)
Sodium: 135 meq/L (ref 135–145)
Total Bilirubin: 0.6 mg/dL (ref 0.2–1.2)
Total Protein: 6.9 g/dL (ref 6.0–8.3)

## 2024-07-29 LAB — CBC
HCT: 43.4 % (ref 36.0–46.0)
Hemoglobin: 14.4 g/dL (ref 12.0–15.0)
MCHC: 33.3 g/dL (ref 30.0–36.0)
MCV: 91.2 fl (ref 78.0–100.0)
Platelets: 226 K/uL (ref 150.0–400.0)
RBC: 4.76 Mil/uL (ref 3.87–5.11)
RDW: 13.5 % (ref 11.5–15.5)
WBC: 5.4 K/uL (ref 4.0–10.5)

## 2024-07-29 LAB — TSH: TSH: 2.26 u[IU]/mL (ref 0.35–5.50)

## 2024-07-29 MED ORDER — LOW-OGESTREL 0.3-30 MG-MCG PO TABS: 1.0000 | ORAL_TABLET | Freq: Every day | ORAL | 4 refills | Status: AC

## 2024-07-29 NOTE — Patient Instructions (Addendum)

## 2024-07-29 NOTE — Progress Notes (Signed)
 Patient ID: Traci Flynn, female  DOB: 05/27/1971, 53 y.o.   MRN: [id number] Patient Care Team    Relationship Specialty Notifications Start End  Catherine Charlies LABOR, DO PCP - General Family Medicine  09/24/18     Chief Complaint  Patient presents with   Annual Exam    Pt is fasting.  OCP maintenance Influenza vaccine- oct  Prevnar vaccine Discussed hepatitis B vaccine- declined    Subjective: Traci Flynn is a 53 y.o.  Female  present for CPE and chronic condition management All past medical history, surgical history, allergies, family history, immunizations, medications and social history were updated in the electronic medical record today. All recent labs, ED visits and hospitalizations within the last year were reviewed.  Health maintenance:  Colonoscopy: no fhx.  04/11/2020- Dr. Shila- 7 yr follow- polyps Mammogram: no fhx. completed: 01/23/2024 [facility]> order placed for next yr.  Cervical cancer screening: due-completed today with cotesting Immunizations: tdap 2019 UTD, Influenza-declined>encouraged yearly),Shingrix series completed, hepatitis B- declined, Prevnar- completed today Infectious disease screening: HIV completed. hep C completed DEXA: routine screening at 60 Assistive device: none Oxygen [id number] Patient has a Dental home. Hospitalizations/ED visits: Reviewed  OCP maintenance -compliant with daily use.    07/29/2024    8:09 AM 07/12/2023   10:22 AM 04/04/2022    8:54 AM 03/17/2021    9:58 AM 01/26/2020   10:25 AM  Depression screen PHQ 2/9  Decreased Interest 0 0 0 0 0  Down, Depressed, Hopeless 0 0 0 0 0  PHQ - 2 Score 0 0 0 0 0  Altered sleeping 0      Tired, decreased energy 0      Change in appetite 0      Feeling bad or failure about yourself  0      Trouble concentrating 0      Moving slowly or fidgety/restless 0      Suicidal thoughts 0      PHQ-9 Score 0      Difficult doing work/chores Not difficult at all          07/29/2024    8:09 AM  09/24/2018    9:21 AM  GAD 7 : Generalized Anxiety Score  Nervous, Anxious, on Edge 0 0  Control/stop worrying 0 0  Worry too much - different things 0 0  Trouble relaxing 0 0  Restless 0 0  Easily annoyed or irritable 0 0  Afraid - awful might happen 0 0  Total GAD 7 Score 0 0  Anxiety Difficulty Not difficult at all Not difficult at all      07/29/2024    8:09 AM  Fall Risk  Falls in the past year? 0  Was there an injury with Fall? 0  Fall Risk Category Calculator 0  Fall risk Follow up Falls evaluation completed     Immunization History  Administered Date(s) Administered   Influenza Inj Mdck Quad Pf 07/05/2022   Influenza,inj,Quad PF,6+ Mos 06/17/2019   Influenza-Unspecified 06/17/2019, 06/19/2023, 06/08/2024   PFIZER(Purple Top)SARS-COV-2 Vaccination 11/20/2019, 12/14/2019, 07/27/2020   PNEUMOCOCCAL CONJUGATE-20 07/29/2024   Pfizer Covid-19 Vaccine Bivalent Booster 42yrs & up 06/02/2021   Pfizer(Comirnaty)Fall Seasonal Vaccine 12 years and older 07/05/2022, 07/08/2023   Tdap 09/30/2017   Unspecified SARS-COV-2 Vaccination 07/08/2023   Zoster Recombinant(Shingrix) 05/16/2021, 09/13/2021     Past Medical History:  Diagnosis Date   Allergies    Seasonal    Allergy    Blood in stool    Pt  has hemorrhoids   Allergies  Allergen Reactions   Corn Pollen     Tree pollen and ragweed   Soybean Extract Allergy Skin Test     Soybean pollen only- able to eat soybean containing food   Past Surgical History:  Procedure Laterality Date   BREAST BIOPSY Left    nipple   EYE SURGERY Left 01/05/2011   Preventative Lattice Surgery    Family History  Problem Relation Age of Onset   Diabetes Mother    Hypertension Mother    Pancreatic cancer Father    Non-Hodgkin's lymphoma Father    Alzheimer's disease Maternal Grandmother    Heart disease Maternal Grandmother    Bladder Cancer Maternal Grandfather    Heart disease Paternal Grandmother    Kidney failure Paternal  Grandfather    Colon cancer Neg Hx    Esophageal cancer Neg Hx    Stomach cancer Neg Hx    Rectal cancer Neg Hx    Social History   Social History Narrative   Marital status/children/pets: married, 1 child   Education/employment: BS. Printmaker:      -Wears a bicycle helmet riding a bike: Yes     -smoke alarm in the home:Yes     - wears seatbelt: Yes     - Feels safe in their relationships: Yes    Allergies as of 07/29/2024       Reactions   Corn Pollen    Tree pollen and ragweed   Soybean Extract Allergy Skin Test    Soybean pollen only- able to eat soybean containing food        Medication List        Accurate as of July 29, 2024  8:26 AM. If you have any questions, ask your nurse or doctor.          cetirizine 10 MG tablet Commonly known as: ZYRTEC Take 10 mg by mouth daily.   Low-Ogestrel  0.3-30 MG-MCG tablet Generic drug: norgestrel -ethinyl estradiol  Take 1 tablet by mouth daily. Take 1 tablet by mouth daily.   multivitamin with minerals Tabs tablet Take 1 tablet by mouth daily.        All past medical history, surgical history, allergies, family history, immunizations andmedications were updated in the EMR today and reviewed under the history and medication portions of their EMR.     No results found for this or any previous visit (from the past 2160 hours).    Review of Systems  Constitutional: Negative.   HENT: Negative.    Eyes: Negative.   Respiratory: Negative.    Cardiovascular: Negative.   Gastrointestinal: Negative.   Genitourinary: Negative.   Musculoskeletal: Negative.   Skin: Negative.   Neurological: Negative.   Endo/Heme/Allergies: Negative.   Psychiatric/Behavioral: Negative.    All other systems reviewed and are negative.  14 pt review of systems performed and negative (unless mentioned in an HPI)  Objective: BP 124/80   Pulse 83   Temp 98.2 F (36.8 C)   Ht 5' 3 (1.6 m)   Wt 201 lb 9.6 oz (91.4 kg)    LMP 07/15/2024   SpO2 100%   BMI 35.71 kg/m  Physical Exam Vitals and nursing note reviewed. Exam conducted with a chaperone present.  Constitutional:      General: She is not in acute distress.    Appearance: Normal appearance. She is not ill-appearing or toxic-appearing.  HENT:     Head: Normocephalic and atraumatic.     Right  Ear: Tympanic membrane, ear canal and external ear normal. There is no impacted cerumen.     Left Ear: Tympanic membrane, ear canal and external ear normal. There is no impacted cerumen.     Nose: No congestion or rhinorrhea.     Mouth/Throat:     Mouth: Mucous membranes are moist.     Pharynx: Oropharynx is clear. No oropharyngeal exudate or posterior oropharyngeal erythema.  Eyes:     General: No scleral icterus.       Right eye: No discharge.        Left eye: No discharge.     Extraocular Movements: Extraocular movements intact.     Conjunctiva/sclera: Conjunctivae normal.     Pupils: Pupils are equal, round, and reactive to light.  Cardiovascular:     Rate and Rhythm: Normal rate and regular rhythm.     Pulses: Normal pulses.     Heart sounds: Normal heart sounds. No murmur heard.    No friction rub. No gallop.  Pulmonary:     Effort: Pulmonary effort is normal. No respiratory distress.     Breath sounds: Normal breath sounds. No stridor. No wheezing, rhonchi or rales.  Chest:     Chest wall: No tenderness.  Abdominal:     General: Abdomen is flat. Bowel sounds are normal. There is no distension.     Palpations: Abdomen is soft. There is no mass.     Tenderness: There is no abdominal tenderness. There is no right CVA tenderness, left CVA tenderness, guarding or rebound.     Hernia: No hernia is present.  Genitourinary:    Exam position: Lithotomy position.     Pubic Area: No rash.      Labia:        Right: No rash, tenderness or lesion.        Left: No rash, tenderness or lesion.      Comments: Breasts: breasts appear normal, symmetrical, no  tenderness on exam, no suspicious masses, no skin or nipple changes or axillary nodes. GYN:  External genitalia within normal limits, normal hair distribution, no lesions. Urethral meatus normal, no lesions. Vaginal mucosa pink, moist, normal rugae, no lesions. No cystocele or rectocele. cervix without lesions, no discharge, friable cervix. Bimanual exam revealed normal uterus.  No bladder/suprapubic fullness, masses or tenderness. No cervical motion tenderness. No adnexal fullness. Anus and perineum within normal limits, no lesions.  Musculoskeletal:        General: No swelling, tenderness or deformity. Normal range of motion.     Cervical back: Normal range of motion and neck supple. No rigidity or tenderness.     Right lower leg: No edema.     Left lower leg: No edema.  Lymphadenopathy:     Cervical: No cervical adenopathy.  Skin:    General: Skin is warm and dry.     Coloration: Skin is not jaundiced or pale.     Findings: No bruising, erythema, lesion or rash.  Neurological:     General: No focal deficit present.     Mental Status: She is alert and oriented to person, place, and time. Mental status is at baseline.     Cranial Nerves: No cranial nerve deficit.     Sensory: No sensory deficit.     Motor: No weakness.     Coordination: Coordination normal.     Gait: Gait normal.     Deep Tendon Reflexes: Reflexes normal.  Psychiatric:        Mood and  Affect: Mood normal.        Behavior: Behavior normal.        Thought Content: Thought content normal.        Judgment: Judgment normal.      No results found.  Assessment/plan: Shantika Bermea is a 53 y.o. female present for CPE and chronic condition manage Routine general medical examination at a health care facility Colonoscopy: no fhx.  04/11/2020- Dr. Shila- 7 yr follow- polyps Mammogram: no fhx. completed: 01/23/2024 [facility]> order placed for next yr.  Cervical cancer screening: due-completed today with cotesting Immunizations:  tdap 2019 UTD, Influenza-declined>encouraged yearly),Shingrix series completed, hepatitis B- declined, Prevnar- completed today Infectious disease screening: HIV completed. hep C completed DEXA: routine screening at 60 Patient was encouraged to exercise greater than 150 minutes a week. Patient was encouraged to choose a diet filled with fresh fruits and vegetables, and lean meats. AVS provided to patient today for education/recommendation on gender specific health and safety maintenance.   Encounter for gynecological examination with Papanicolaou smear of cervix - Cytology - PAP w/ HPV  Breast cancer screening by mammogram - MM 3D SCREENING MAMMOGRAM BILATERAL BREAST; Future Lipid screening/Diabetes mellitus screening/BMI 30-39.9) - Hemoglobin A1c - Lipid panel  Encounter for birth control pills maintenance/Long term current use of hormonal contraceptive Continue low-ogesterel Influenza vaccine needed declined Need for vaccination for pneumococcus - Pneumococcal conjugate vaccine 20-valent Need for hepatitis B vaccination declined   FH: pancreatic cancer- Father and P-Aunt - Ambulatory referral to Genetics   Return in about 1 year (around 07/30/2025) for cpe (20 min), Routine chronic condition follow-up.  Orders Placed This Encounter  Procedures   MM 3D SCREENING MAMMOGRAM BILATERAL BREAST   Pneumococcal conjugate vaccine 20-valent   CBC   Comprehensive metabolic panel with GFR   Hemoglobin A1c   Lipid panel   TSH   Meds ordered this encounter  Medications   norgestrel -ethinyl estradiol  (LOW-OGESTREL ) 0.3-30 MG-MCG tablet    Sig: Take 1 tablet by mouth daily. Take 1 tablet by mouth daily.    Dispense:  90 tablet    Refill:  4   Referral Orders  No referral(s) requested today     Electronically signed by: Charlies Bellini, DO [facility] [place]

## 2024-07-30 ENCOUNTER — Ambulatory Visit: Payer: Self-pay | Admitting: Family Medicine

## 2024-07-31 LAB — CYTOLOGY - PAP
Comment: NEGATIVE
Diagnosis: NEGATIVE
High risk HPV: NEGATIVE

## 2024-09-30 ENCOUNTER — Encounter: Payer: Self-pay | Admitting: Genetic Counselor

## 2024-09-30 ENCOUNTER — Inpatient Hospital Stay: Admitting: Genetic Counselor

## 2024-09-30 ENCOUNTER — Inpatient Hospital Stay

## 2024-09-30 DIAGNOSIS — Z800 Family history of malignant neoplasm of digestive organs: Secondary | ICD-10-CM

## 2024-09-30 NOTE — Progress Notes (Addendum)
 REFERRING PROVIDER: Catherine Fuller A, DO [address] [place],  [place] [address]  PRIMARY PROVIDER:  Catherine, Renee A, DO  PRIMARY REASON FOR VISIT:  1. Family history of malignant neoplasm of gastrointestinal tract     HISTORY OF PRESENT ILLNESS:   Ms. Traci Flynn, a 54 y.o. female, was seen for a [facility] cancer genetics consultation at the request of Kuneff, Renee A, DO due to a family history of cancer.  Traci Flynn presents to clinic today to discuss the possibility of a hereditary predisposition to cancer, to discuss genetic testing, and to further clarify her future cancer risks, as well as potential cancer risks for family members.   Traci Flynn is a 54 y.o. female with no personal history of cancer.    RELEVANT MEDICAL HISTORY:  Menarche was at age 10.  First live birth at age 40.  OCP use for approximately 20 years.  Ovaries intact: yes.  Uterus intact: yes.  Menopausal status: premenopausal.  HRT use: 0 years. Colonoscopy: yes; 2021, one adenomatous polyp, scheduled for follow up in 2027. Mammogram within the last year: yes. Density B Number of breast biopsies: 0. Up to date with pelvic exams: yes.   Past Medical History:  Diagnosis Date   Allergies    Seasonal    Allergy    Blood in stool    Pt has hemorrhoids    Social History   Socioeconomic History   Marital status: Married    Spouse name: Not on file   Number of children: Not on file   Years of education: Not on file   Highest education level: Bachelor's degree (e.g., BA, AB, BS)  Occupational History   Not on file  Tobacco Use   Smoking status: Never   Smokeless tobacco: Never  Vaping Use   Vaping status: Never Used  Substance and Sexual Activity   Alcohol use: Never   Drug use: Never   Sexual activity: Yes    Partners: Male    Birth control/protection: Pill  Other Topics Concern   Not on file  Social History Narrative   Marital status/children/pets: married, 1 child   Education/employment: BS.  Homemaker   Safety:      -Wears a bicycle helmet riding a bike: Yes     -smoke alarm in the home:Yes     - wears seatbelt: Yes     - Feels safe in their relationships: Yes   Social Drivers of Health   Tobacco Use: Low Risk (07/29/2024)   Patient History    Smoking Tobacco Use: Never    Smokeless Tobacco Use: Never    Passive Exposure: Not on file  Financial Resource Strain: Low Risk (07/27/2024)   Overall Financial Resource Strain (CARDIA)    Difficulty of Paying Living Expenses: Not hard at all  Food Insecurity: No Food Insecurity (07/27/2024)   Epic    Worried About Radiation Protection Practitioner of Food in the Last Year: Never true    Ran Out of Food in the Last Year: Never true  Transportation Needs: No Transportation Needs (07/27/2024)   Epic    Lack of Transportation (Medical): No    Lack of Transportation (Non-Medical): No  Physical Activity: Insufficiently Active (07/27/2024)   Exercise Vital Sign    Days of Exercise per Week: 1 day    Minutes of Exercise per Session: 40 min  Stress: No Stress Concern Present (07/27/2024)   [employer] of Occupational Health - Occupational Stress Questionnaire    Feeling of Stress:  Only a little  Social Connections: Moderately Integrated (07/27/2024)   Social Connection and Isolation Panel    Frequency of Communication with Friends and Family: Once a week    Frequency of Social Gatherings with Friends and Family: Once a week    Attends Religious Services: More than 4 times per year    Active Member of [employer] or Organizations: Yes    Attends Banker Meetings: More than 4 times per year    Marital Status: Married  Depression (PHQ2-9): Low Risk (07/29/2024)   Depression (PHQ2-9)    PHQ-2 Score: 0  Alcohol Screen: Not on file  Housing: Unknown (07/27/2024)   Epic    Unable to Pay for Housing in the Last Year: No    Number of Times Moved in the Last Year: Not on file    Homeless in the Last Year: No  Utilities: Not on file  Health Literacy:  Not on file     FAMILY HISTORY:  We obtained a detailed, 4-generation family history.  Significant diagnoses are listed below: Family History  Problem Relation Age of Onset   Diabetes Mother    Hypertension Mother    Pancreatic cancer Father 39   Non-Hodgkin's lymphoma Father 46   Pancreatic cancer Paternal Aunt 73   Alzheimer's disease Maternal Grandmother    Heart disease Maternal Grandmother    Bladder Cancer Maternal Grandfather 63   Heart disease Paternal Grandmother    Kidney failure Paternal Grandfather    Colon cancer Neg Hx    Esophageal cancer Neg Hx    Stomach cancer Neg Hx    Rectal cancer Neg Hx     Traci Flynn is reports two paternal cousins that had normal genetic testing for hereditary cancer. Paternal ancestry reported as German and English, maternal ancestry English. There is no reported Ashkenazi Jewish ancestry.     GENETIC COUNSELING ASSESSMENT: [name] is a 54 y.o. female with a family history of cancer which is somewhat suggestive of a hereditary predisposition to cancer given the paternal history of one first and one second degree relative diagnosed with pancreatic cancer. We, therefore, discussed and recommended the following at today's visit.   DISCUSSION: We discussed that approximately 10% of cancer is hereditary, with most cases of hereditary pancreatic cancer associated with pathogenic variants in BRCA1/2.  There are other genes that can be associated with hereditary pancreatic cancer syndromes, including ATM, CDKN2A, MLH1, MSH2, MSH6, EPCAM, PALB2, STK11, TP53.  We discussed that testing is beneficial for several reasons, including knowing about other cancer risks, identifying potential screening and risk-reduction options that may be appropriate, and to understanding if other family members could be at risk for cancer and allowing them to undergo genetic testing.  We reviewed the characteristics, features and inheritance patterns of hereditary cancer  syndromes. We also discussed genetic testing, including the appropriate family members to test, the process of testing, insurance coverage and turn-around-time for results. We discussed the implications of a negative, positive, carrier and/or variant of uncertain significant result. We discussed that negative results would be uninformative given that Ms. Odell does not have a personal history of cancer. We recommended Ms. Landen pursue genetic testing for a panel that contains genes associated with pancreatic cancer.  Ms. Huhta was offered a common hereditary cancer panel (40+ genes) and an expanded pan-cancer panel (70+ genes). Ms. Reicks was informed of the benefits and limitations of each panel, including that expanded pan-cancer panels contain several genes that do not have clear  management guidelines at this point in time.  We also discussed that as the number of genes included on a panel increases, the chances of variants of uncertain significance increases.  After considering the benefits and limitations of each gene panel, Ms. Yeary expressed interesting in [facility]'s Common Hereditary Cancer +RNA insight panel. The [facility] Common Hereditary Cancers panel includes analysis of the following 48 genes: APC, ATM, AXIN2, BAP1, BARD1, BMPR1A, BRCA1, BRCA2, BRIP1, CDH1, CDK4, CDKN2A, CHEK2, CTNNA1, DICER1, EPCAM, FH, GREM1, HOXB13, KIT, MBD4, MEN1, MLH1, MSH2, MSH3, MSH6, MUTYH, NF1, NTHL1, PALB2, PDGFRA, PMS2, POLD1, POLE, PTEN, RAD51C, RAD51D, SDHA, SDHB, SDHC, SDHD, SMAD4, SMARCA4, STK11, TP53, TSC1, TSC2, VHL. RNA analysis included on applicable genes.    Based on Ms. Siegfried's family history of cancer, she meets medical criteria for genetic testing. Though Ms. Darwin is not personally affected, there are no affected family members that are willing/able to undergo hereditary cancer testing. Despite that she meets criteria, she may still have an out of pocket cost. Utilized [facility]'s cost estimator in visit,  $750.00 out of pocket. Would likely qualify for financial assistance through [facility] based on household size and income, capped rate at $250.00.   We discussed that some people do not want to undergo genetic testing due to fear of genetic discrimination.  A federal law called the Genetic Information Non-Discrimination Act (GINA) of 2008 helps protect individuals against genetic discrimination based on their genetic test results.  It impacts both health insurance and employment.  With health insurance, it protects against increased premiums, being kicked off insurance or being forced to take a test in order to be insured.  For employment it protects against hiring, firing and promoting decisions based on genetic test results.  GINA does not apply to those in the [employer], those who work for companies with less than 15 employees, and new life insurance or long-term disability insurance policies.  Health status due to a cancer diagnosis is not protected under GINA.  Pancreatic Cancer Screening/Risk Reduction: The American Gastroenterological Association (AGA) recommends the consideration of pancreatic cancer screening in patients determined to be at high risk, including first-degree relatives of patients with pancreatic cancer with at least two affected genetically related relatives.  Ms. Sousa has a family history of one first degree relative and one second degree relative history of pancreatic cancer. Therefore, pancreatic cancer screening can be discussed with her GI provider.   PLAN: After considering the risks, benefits, and limitations, Ms. Budzynski did not wish to pursue genetic testing at today's visit. She plans to discuss options with her family and will notify us  if she elects to move forward. We understand this decision and remain available to coordinate genetic testing at any time in the future. We, therefore, recommend Ms. Degroff continue to follow the cancer screening guidelines given by her primary  healthcare provider and consider her options for pancreatic cancer screening.   Lastly, we encouraged Ms. Vandenheuvel to remain in contact with cancer genetics annually so that we can continuously update the family history and inform her of any changes in cancer genetics and testing that may be of benefit for this family.   Ms. Dyal questions were answered to her satisfaction today. Our contact information was provided should additional questions or concerns arise. Thank you for the referral and allowing us  to share in the care of your patient.   Burnard Ogren, MS, [facility] Licensed, Retail [email or web address] phone: [phone]   50 minutes were spent on the date of the  encounter in service to the patient including preparation, face-to-face consultation, documentation and care coordination.  The patient was seen alone.  Drs. Gudena and/or Lanny were available to discuss this case as needed.  _______________________________________________________________________ For Office Staff:  Number of people involved in session: 1 Was an Intern/ student involved with case: no

## 2025-07-30 ENCOUNTER — Encounter: Admitting: Family Medicine
